# Patient Record
Sex: Female | Born: 1940 | Race: Black or African American | Hispanic: No | Smoking: Never smoker
Health system: Southern US, Community
[De-identification: ages and names within clinical notes are randomized; demographics above are authoritative.]

## PROBLEM LIST (undated history)

## (undated) DIAGNOSIS — D649 Anemia, unspecified: Secondary | ICD-10-CM

## (undated) DIAGNOSIS — J45909 Unspecified asthma, uncomplicated: Secondary | ICD-10-CM

## (undated) DIAGNOSIS — I10 Essential (primary) hypertension: Secondary | ICD-10-CM

## (undated) DIAGNOSIS — E669 Obesity, unspecified: Secondary | ICD-10-CM

## (undated) DIAGNOSIS — R519 Headache, unspecified: Secondary | ICD-10-CM

## (undated) DIAGNOSIS — E66811 Obesity, class 1: Secondary | ICD-10-CM

## (undated) DIAGNOSIS — R51 Headache: Secondary | ICD-10-CM

## (undated) DIAGNOSIS — J309 Allergic rhinitis, unspecified: Secondary | ICD-10-CM

## (undated) DIAGNOSIS — Z8669 Personal history of other diseases of the nervous system and sense organs: Secondary | ICD-10-CM

## (undated) HISTORY — DX: Obesity, class 1: E66.811

## (undated) HISTORY — DX: Personal history of other diseases of the nervous system and sense organs: Z86.69

## (undated) HISTORY — DX: Headache: R51

## (undated) HISTORY — DX: Headache, unspecified: R51.9

## (undated) HISTORY — PX: BUNIONECTOMY: SHX129

## (undated) HISTORY — DX: Allergic rhinitis, unspecified: J30.9

## (undated) HISTORY — DX: Obesity, unspecified: E66.9

## (undated) HISTORY — DX: Anemia, unspecified: D64.9

## (undated) HISTORY — PX: ABDOMINAL HYSTERECTOMY: SHX81

---

## 1997-08-27 ENCOUNTER — Ambulatory Visit (HOSPITAL_COMMUNITY): Admission: RE | Admit: 1997-08-27 | Discharge: 1997-08-27 | Payer: Self-pay | Admitting: Internal Medicine

## 1998-02-25 ENCOUNTER — Ambulatory Visit (HOSPITAL_COMMUNITY): Admission: RE | Admit: 1998-02-25 | Discharge: 1998-02-25 | Payer: Self-pay | Admitting: Internal Medicine

## 1998-05-20 ENCOUNTER — Inpatient Hospital Stay (HOSPITAL_COMMUNITY): Admission: AD | Admit: 1998-05-20 | Discharge: 1998-05-22 | Payer: Self-pay | Admitting: Internal Medicine

## 1998-05-20 ENCOUNTER — Encounter: Payer: Self-pay | Admitting: Internal Medicine

## 1998-05-21 ENCOUNTER — Encounter: Payer: Self-pay | Admitting: Internal Medicine

## 1998-09-07 ENCOUNTER — Other Ambulatory Visit: Admission: RE | Admit: 1998-09-07 | Discharge: 1998-09-07 | Payer: Self-pay | Admitting: Internal Medicine

## 1998-10-13 ENCOUNTER — Ambulatory Visit (HOSPITAL_COMMUNITY): Admission: RE | Admit: 1998-10-13 | Discharge: 1998-10-13 | Payer: Self-pay | Admitting: Gastroenterology

## 1999-03-12 ENCOUNTER — Encounter: Payer: Self-pay | Admitting: Emergency Medicine

## 1999-03-13 ENCOUNTER — Inpatient Hospital Stay (HOSPITAL_COMMUNITY): Admission: EM | Admit: 1999-03-13 | Discharge: 1999-03-19 | Payer: Self-pay | Admitting: Emergency Medicine

## 1999-04-21 ENCOUNTER — Encounter: Payer: Self-pay | Admitting: Internal Medicine

## 1999-04-21 ENCOUNTER — Encounter: Admission: RE | Admit: 1999-04-21 | Discharge: 1999-04-21 | Payer: Self-pay | Admitting: Internal Medicine

## 1999-07-26 ENCOUNTER — Ambulatory Visit (HOSPITAL_COMMUNITY): Admission: RE | Admit: 1999-07-26 | Discharge: 1999-07-26 | Payer: Self-pay | Admitting: Internal Medicine

## 1999-07-26 LAB — PULMONARY FUNCTION TEST

## 1999-09-13 ENCOUNTER — Emergency Department (HOSPITAL_COMMUNITY): Admission: EM | Admit: 1999-09-13 | Discharge: 1999-09-13 | Payer: Self-pay | Admitting: Emergency Medicine

## 1999-10-03 ENCOUNTER — Encounter: Payer: Self-pay | Admitting: Internal Medicine

## 1999-10-03 ENCOUNTER — Ambulatory Visit (HOSPITAL_COMMUNITY): Admission: RE | Admit: 1999-10-03 | Discharge: 1999-10-03 | Payer: Self-pay | Admitting: Internal Medicine

## 1999-10-06 ENCOUNTER — Encounter: Payer: Self-pay | Admitting: Internal Medicine

## 1999-10-07 ENCOUNTER — Inpatient Hospital Stay (HOSPITAL_COMMUNITY): Admission: RE | Admit: 1999-10-07 | Discharge: 1999-10-12 | Payer: Self-pay | Admitting: Internal Medicine

## 2000-04-26 ENCOUNTER — Encounter: Admission: RE | Admit: 2000-04-26 | Discharge: 2000-04-26 | Payer: Self-pay | Admitting: Internal Medicine

## 2000-04-26 ENCOUNTER — Encounter: Payer: Self-pay | Admitting: Internal Medicine

## 2000-07-02 ENCOUNTER — Other Ambulatory Visit: Admission: RE | Admit: 2000-07-02 | Discharge: 2000-07-02 | Payer: Self-pay | Admitting: Internal Medicine

## 2000-12-14 ENCOUNTER — Encounter: Payer: Self-pay | Admitting: Internal Medicine

## 2000-12-14 ENCOUNTER — Encounter: Admission: RE | Admit: 2000-12-14 | Discharge: 2000-12-14 | Payer: Self-pay | Admitting: Internal Medicine

## 2001-04-30 ENCOUNTER — Encounter: Payer: Self-pay | Admitting: Internal Medicine

## 2001-04-30 ENCOUNTER — Encounter: Admission: RE | Admit: 2001-04-30 | Discharge: 2001-04-30 | Payer: Self-pay | Admitting: Internal Medicine

## 2001-12-12 ENCOUNTER — Other Ambulatory Visit: Admission: RE | Admit: 2001-12-12 | Discharge: 2001-12-12 | Payer: Self-pay | Admitting: Internal Medicine

## 2001-12-13 ENCOUNTER — Encounter: Payer: Self-pay | Admitting: Internal Medicine

## 2001-12-13 ENCOUNTER — Encounter: Admission: RE | Admit: 2001-12-13 | Discharge: 2001-12-13 | Payer: Self-pay | Admitting: Internal Medicine

## 2002-05-27 ENCOUNTER — Encounter: Payer: Self-pay | Admitting: Internal Medicine

## 2002-05-27 ENCOUNTER — Encounter: Admission: RE | Admit: 2002-05-27 | Discharge: 2002-05-27 | Payer: Self-pay | Admitting: Internal Medicine

## 2003-06-22 ENCOUNTER — Encounter: Admission: RE | Admit: 2003-06-22 | Discharge: 2003-06-22 | Payer: Self-pay | Admitting: Internal Medicine

## 2003-07-24 ENCOUNTER — Ambulatory Visit (HOSPITAL_COMMUNITY): Admission: RE | Admit: 2003-07-24 | Discharge: 2003-07-24 | Payer: Self-pay | Admitting: Cardiology

## 2003-12-03 ENCOUNTER — Other Ambulatory Visit: Admission: RE | Admit: 2003-12-03 | Discharge: 2003-12-03 | Payer: Self-pay | Admitting: Internal Medicine

## 2004-07-01 ENCOUNTER — Encounter: Admission: RE | Admit: 2004-07-01 | Discharge: 2004-07-01 | Payer: Self-pay | Admitting: Internal Medicine

## 2004-12-08 ENCOUNTER — Other Ambulatory Visit: Admission: RE | Admit: 2004-12-08 | Discharge: 2004-12-08 | Payer: Self-pay | Admitting: Internal Medicine

## 2004-12-13 ENCOUNTER — Encounter: Admission: RE | Admit: 2004-12-13 | Discharge: 2004-12-13 | Payer: Self-pay | Admitting: Internal Medicine

## 2004-12-16 ENCOUNTER — Ambulatory Visit (HOSPITAL_COMMUNITY): Admission: RE | Admit: 2004-12-16 | Discharge: 2004-12-16 | Payer: Self-pay | Admitting: Gastroenterology

## 2004-12-16 ENCOUNTER — Encounter (INDEPENDENT_AMBULATORY_CARE_PROVIDER_SITE_OTHER): Payer: Self-pay | Admitting: Specialist

## 2004-12-21 ENCOUNTER — Other Ambulatory Visit: Admission: RE | Admit: 2004-12-21 | Discharge: 2004-12-21 | Payer: Self-pay | Admitting: Interventional Radiology

## 2004-12-21 ENCOUNTER — Encounter: Admission: RE | Admit: 2004-12-21 | Discharge: 2004-12-21 | Payer: Self-pay | Admitting: Internal Medicine

## 2004-12-21 ENCOUNTER — Encounter (INDEPENDENT_AMBULATORY_CARE_PROVIDER_SITE_OTHER): Payer: Self-pay | Admitting: Specialist

## 2005-07-04 ENCOUNTER — Encounter: Admission: RE | Admit: 2005-07-04 | Discharge: 2005-07-04 | Payer: Self-pay | Admitting: Internal Medicine

## 2005-08-03 ENCOUNTER — Encounter: Admission: RE | Admit: 2005-08-03 | Discharge: 2005-08-03 | Payer: Self-pay | Admitting: Internal Medicine

## 2005-11-10 ENCOUNTER — Emergency Department (HOSPITAL_COMMUNITY): Admission: EM | Admit: 2005-11-10 | Discharge: 2005-11-10 | Payer: Self-pay | Admitting: Emergency Medicine

## 2006-07-10 ENCOUNTER — Encounter: Admission: RE | Admit: 2006-07-10 | Discharge: 2006-07-10 | Payer: Self-pay | Admitting: Internal Medicine

## 2007-01-16 ENCOUNTER — Encounter: Admission: RE | Admit: 2007-01-16 | Discharge: 2007-01-16 | Payer: Self-pay | Admitting: Internal Medicine

## 2007-07-12 ENCOUNTER — Encounter: Admission: RE | Admit: 2007-07-12 | Discharge: 2007-07-12 | Payer: Self-pay | Admitting: Internal Medicine

## 2008-07-13 ENCOUNTER — Encounter: Admission: RE | Admit: 2008-07-13 | Discharge: 2008-07-13 | Payer: Self-pay | Admitting: Internal Medicine

## 2009-07-14 ENCOUNTER — Encounter: Admission: RE | Admit: 2009-07-14 | Discharge: 2009-07-14 | Payer: Self-pay | Admitting: Internal Medicine

## 2009-07-30 ENCOUNTER — Encounter: Admission: RE | Admit: 2009-07-30 | Discharge: 2009-07-30 | Payer: Self-pay | Admitting: Internal Medicine

## 2010-07-19 ENCOUNTER — Encounter
Admission: RE | Admit: 2010-07-19 | Discharge: 2010-07-19 | Payer: Self-pay | Source: Home / Self Care | Attending: Internal Medicine | Admitting: Internal Medicine

## 2010-10-06 ENCOUNTER — Ambulatory Visit
Admission: RE | Admit: 2010-10-06 | Discharge: 2010-10-06 | Disposition: A | Discharge status: Home / Self Care | Payer: BC Managed Care – PPO | Source: Ambulatory Visit | Attending: Internal Medicine | Admitting: Internal Medicine

## 2010-10-06 ENCOUNTER — Other Ambulatory Visit: Payer: Self-pay | Admitting: Internal Medicine

## 2010-10-06 DIAGNOSIS — R52 Pain, unspecified: Secondary | ICD-10-CM

## 2010-11-04 NOTE — Op Note (Signed)
Emily Mcclain, Emily Mcclain                ACCOUNT NO.:  [id number]   MEDICAL RECORD NO.:  [id number]          PATIENT TYPE:  AMB   LOCATION:  ENDO                         FACILITY:  [facility]   PHYSICIAN:  Anselmo Rod, M.D.  DATE OF BIRTH:  Nov 19, 1940   DATE OF PROCEDURE:  12/16/2004  DATE OF DISCHARGE:                                 OPERATIVE REPORT   PROCEDURES PERFORMED:  Colonoscopy with snare polypectomy x 1.   ENDOSCOPIST:  Anselmo Rod, M.D.   INSTRUMENT USED:  Olympus video colonoscope.   INDICATION FOR PROCEDURE:  A 70 year old African American female undergoing  a screen colonoscopy to rule out colonic polyps, masses, etc.   PREPROCEDURE PREPARATION:  Informed consent was procured from the patient.  The patient fasted for eight hours prior to the procedure and prepped with a  bottle of magnesium citrate and a gallon of GoLYTELY the night prior to the  procedure.  The risks and benefits of the procedure, including a 10% missed  rate of cancer and polyps, were discussed with her as well.   PREPROCEDURE PHYSICAL EXAMINATION:  VITAL SIGNS:  Stable.  NECK:  Supple.  CHEST:  Clear to auscultation.  HEART:  S1 and S2 regular.  ABDOMEN:  Soft with normal bowel sounds.   DESCRIPTION OF PROCEDURE:  The patient was placed in the left lateral  decubitus position.  Sedated with 75 mg of Demerol and 7.5 mg of Versed in  slow incremental doses. Once the patient was adequately sedated and  maintained on low-flow oxygen and continuous cardiac monitoring, the Olympus  video colonoscope was advanced from the rectum to the cecum.  The patient  had an excellent prep.  A large pedunculated polyp was snared from the  proximal right colon.  Small internal hemorrhoids were seen on retroflexion.  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  The  appendiceal orifice and ileocecal valve were clearly visualized and  photographed.  No other abnormalities were noted.  The  patient tolerated the  procedure well without complications.   IMPRESSION:  1.  Large pedunculated polyp snared from the proximal right colon.  2.  Isolated diverticulum noted in proximal right colon close to the cecum.  3.  Small internal hemorrhoids seen on retroflexion.  4.  Healthy-appearing distal right colon, transverse colon, descending      colon, rectosigmoid colon and rectum.   RECOMMENDATIONS:  1.  Await pathology results.  2.  Repeat colonoscopy depending on pathology results.  3.  Avoid all nonsteroidals, including aspirin, for the next two weeks.  4.  Outpatient followup as the need arises in the future.       JNM/MEDQ  D:  12/17/2004  T:  12/18/2004  Job:  [id number]   cc:   Minerva Areola L. August Saucer, M.D.  [address] [address]  [place]  [place] [address]  Fax: [phone]

## 2010-11-04 NOTE — Discharge Summary (Signed)
[place]  Patient:    Emily Mcclain, Emily Mcclain                       MRN: [id number] Adm. Date:  [id number] Disc. Date: [id number] Attending:  Gwenyth Bender                           Discharge Summary  FINAL DIAGNOSES: 1. Extrinsic asthma with acute exacerbation. 2. Depressive disorder. 3. Diaphragmatic hernia. 4. Esophageal reflux. 5. Hypertension. 6. Hypokalemia.  OPERATIONS/PROCEDURES:  None.  HISTORY OF PRESENT ILLNESS:  This was one of several [facility] admissions for this 70 year old married black female with long-standing history of asthma who presented to the office complaining of increasing shortness of breath.  She had been doing well until two days prior to admission when she became more short of breath at work after being exposed to new carpeting.  The patient noted increasing tightness in her chest during that time with intermittent cough as well.  She used her inhalers, but this did not relieve her symptoms.  She subsequently noted progressive decline and required admission to the hospital for further evaluation and therapy.  PAST MEDICAL HISTORY:  As per admission H&P.  PHYSICAL EXAMINATION:  As per admission H&P.  HOSPITAL COURSE:  The patient was admitted for further treatment of her acute asthma.  She was placed on IV Solu-Medrol as well as IV antibiotics and intensive treatment with nebulizer therapy.  She was also started on low-flow oxygen for support.  She began with daily peak flows as a gauge for her asthma.  She notably started as low as 150.  The patient also complained of intermittent heartburn with reflux-type symptoms.  She was known previously to have a hiatal hernia and was, therefore, started back on a GERD regimen with Protonix.  At the time of presentation, her potassium was low also at 3.2.  This required replacement. Over the subsequent days, the patient made gradual improvement.  As she remained on her  prednisone, it was noted that her blood pressure did begin to increase.  She subsequently was given Cardizem low dose per Dr. Sharyn Lull, which she tolerated well.  With continued therapy, her pulmonary status did improve substantially.  She, however, reached a maximum peak flow in the hospital at 230.  By this time, she was more ambulatory.  She had significant decrease in wheezing as well.  The patients IV Solu-Medrol was tapered, and she was switched over the prednisone orally with gradual taper.  It should be noted that she was also given Singulair at a dose of 10 mg p.o. q.d. which she tolerated well.  By October 12, 1999, the patient was felt to be stable for discharge.  DISCHARGE MEDICATIONS:  1. Tiazac 120 mg p.o. q.d.  2. Claritin 10 mg q.d.  3. Zoloft 50 mg q.h.s.  4. Protonix in place of Prevacid 40 mg q.h.s.  5. Singulair 10 mg q.d.  6. Ventolin hand-held nebulizer q.i.d.  7. Atrovent 2.5 cc via hand-held nebulizer q.i.d.  8. Prednisone 5 mg Dosepak with taper.  9. She will continue on Flovent 220 mcg 2 puffs b.i.d. 10. Serevent 2 puffs b.i.d.  DIET:  A 4 g sodium diet.  FOLLOW-UP:  She will call the office for a follow-up appointment in one weeks time. DD:  11/23/99 TD:  11/28/99 Job: [id number]

## 2010-12-26 ENCOUNTER — Other Ambulatory Visit (HOSPITAL_COMMUNITY)
Admission: RE | Admit: 2010-12-26 | Discharge: 2010-12-26 | Disposition: A | Discharge status: Home / Self Care | Payer: Medicare Other | Source: Ambulatory Visit | Attending: Internal Medicine | Admitting: Internal Medicine

## 2010-12-26 ENCOUNTER — Other Ambulatory Visit: Payer: Self-pay | Admitting: Internal Medicine

## 2010-12-26 DIAGNOSIS — Z124 Encounter for screening for malignant neoplasm of cervix: Secondary | ICD-10-CM | POA: Insufficient documentation

## 2011-06-22 ENCOUNTER — Other Ambulatory Visit: Payer: Self-pay | Admitting: Internal Medicine

## 2011-06-22 DIAGNOSIS — Z1231 Encounter for screening mammogram for malignant neoplasm of breast: Secondary | ICD-10-CM

## 2011-07-21 ENCOUNTER — Ambulatory Visit
Admission: RE | Admit: 2011-07-21 | Discharge: 2011-07-21 | Disposition: A | Discharge status: Home / Self Care | Payer: Medicare Other | Source: Ambulatory Visit | Attending: Internal Medicine | Admitting: Internal Medicine

## 2011-07-21 DIAGNOSIS — Z1231 Encounter for screening mammogram for malignant neoplasm of breast: Secondary | ICD-10-CM

## 2011-12-10 ENCOUNTER — Emergency Department (HOSPITAL_BASED_OUTPATIENT_CLINIC_OR_DEPARTMENT_OTHER): Payer: Medicare Other

## 2011-12-10 ENCOUNTER — Emergency Department (HOSPITAL_BASED_OUTPATIENT_CLINIC_OR_DEPARTMENT_OTHER)
Admission: EM | Admit: 2011-12-10 | Discharge: 2011-12-10 | Disposition: A | Discharge status: Home / Self Care | Payer: Medicare Other | Attending: Emergency Medicine | Admitting: Emergency Medicine

## 2011-12-10 ENCOUNTER — Encounter (HOSPITAL_BASED_OUTPATIENT_CLINIC_OR_DEPARTMENT_OTHER): Payer: Self-pay | Admitting: *Deleted

## 2011-12-10 DIAGNOSIS — R5381 Other malaise: Secondary | ICD-10-CM | POA: Insufficient documentation

## 2011-12-10 DIAGNOSIS — R51 Headache: Secondary | ICD-10-CM | POA: Insufficient documentation

## 2011-12-10 DIAGNOSIS — H539 Unspecified visual disturbance: Secondary | ICD-10-CM | POA: Insufficient documentation

## 2011-12-10 DIAGNOSIS — R059 Cough, unspecified: Secondary | ICD-10-CM | POA: Insufficient documentation

## 2011-12-10 DIAGNOSIS — R05 Cough: Secondary | ICD-10-CM | POA: Insufficient documentation

## 2011-12-10 HISTORY — DX: Unspecified asthma, uncomplicated: J45.909

## 2011-12-10 HISTORY — DX: Essential (primary) hypertension: I10

## 2011-12-10 NOTE — ED Notes (Signed)
Pt reports developing a severe headache yesterday morning. States that she has no history of headaches. Came to the ER for further evaluation. Pt neuro intact.

## 2011-12-10 NOTE — ED Provider Notes (Signed)
History   This chart was scribed for Rolan Bucco, MD by Charolett Bumpers . The patient was seen in room MH05/MH05.    CSN: [id number]  Arrival date & time 12/10/11  1507   First MD Initiated Contact with Patient 12/10/11 1746      Chief Complaint  Patient presents with  . Headache    (Consider location/radiation/quality/duration/timing/severity/associated sxs/prior treatment) HPI Emily Mcclain is a 71 y.o. female who presents to the Emergency Department complaining of intermittent, moderate left-sided frontal headache behind left eye and radiates to left side of neck that started yesterday. Patient describes the onset of the headache as sudden. Patient describes the headache as throbbing. Patient states that her headache subsided yesterday, but returned this morning. Patient states that her headache currently has improved. Patient states that she took OTC sinus medication with her last dose this morning around 8 am with some relief. Patient denies any double-vision or blurry vision. Patient reports some visual disturbance in her right eye with the initial onset (glare, that lasted only a couple of minutes). Patient denies any facial numbness, trouble talking, trouble swallowing, or dizziness. Patient denies any numbness, weakness or tingling in upper extremities. Patient denies any leg pain. Patient reports bilaterally "funny feeling in her thighs" that started yesterday. Says that they are not numb/weak/tingling/painful.  No symptoms now.  Patient states that she felt off balanced for a few seconds at a time. No balance problems since then.  Patient denies any burning, pain or numbness in lower extremities. Patient reports a mild associated cough and states that her headache is aggravated with coughing. Patient denies any h/o headaches or migraines. Patient denies any recent head injuries.   Past Medical History  Diagnosis Date  . Hypertension   . Asthma     Past Surgical History    Procedure Date  . Abdominal hysterectomy     History reviewed. No pertinent family history.  History  Substance Use Topics  . Smoking status: Never Smoker   . Smokeless tobacco: Not on file  . Alcohol Use: No    OB History    Grav Para Term Preterm Abortions TAB SAB Ect Mult Living                  Review of Systems  Constitutional: Negative for fever.  HENT: Negative for congestion, sore throat and rhinorrhea.   Eyes: Positive for visual disturbance.  Respiratory: Positive for cough. Negative for shortness of breath.   Cardiovascular: Negative for chest pain.  Gastrointestinal: Negative for nausea, vomiting and abdominal pain.  Genitourinary: Negative for dysuria.  Skin: Negative for rash.  Neurological: Positive for weakness and headaches.  All other systems reviewed and are negative.    Allergies  Review of patient's allergies indicates no known allergies.  Home Medications   Current Outpatient Rx  Name Route Sig Dispense Refill  . CETIRIZINE HCL 10 MG PO TABS Oral Take 10 mg by mouth daily. Patient uses this medication for allergies.      BP 169/86  Pulse 62  Temp 98.1 F (36.7 C) (Oral)  Resp 20  Ht 4\' 9"  (1.448 m)  Wt 149 lb (67.586 kg)  BMI 32.24 kg/m2  SpO2 100%  Physical Exam  Nursing note and vitals reviewed. Constitutional: She is oriented to person, place, and time. She appears well-developed and well-nourished. No distress.  HENT:  Head: Normocephalic and atraumatic.       No facial pain or tenderness over sinuses.  Eyes: EOM are normal. Pupils are equal, round, and reactive to light.  Neck: Normal range of motion. Neck supple. No tracheal deviation present.  Cardiovascular: Normal rate, regular rhythm and normal heart sounds.   Pulmonary/Chest: Effort normal and breath sounds normal. No respiratory distress.  Abdominal: Soft. Bowel sounds are normal. She exhibits no distension. There is no tenderness.  Musculoskeletal: Normal range of  motion. She exhibits no edema.  Neurological: She is alert and oriented to person, place, and time. She has normal strength. No cranial nerve deficit or sensory deficit. Coordination normal. GCS eye subscore is 4. GCS verbal subscore is 5. GCS motor subscore is 6.       Finger to nose test normal   Skin: Skin is warm and dry.  Psychiatric: She has a normal mood and affect. Her behavior is normal.    ED Course  Procedures (including critical care time)  DIAGNOSTIC STUDIES: Oxygen Saturation is 100% on room air, normal by my interpretation.    COORDINATION OF CARE:  1800: Discussed planned course of treatment with the patient, who is agreeable at this time.    No results found for this or any previous visit. Ct Head Wo Contrast  12/10/2011  *RADIOLOGY REPORT*  Clinical Data: Headache.  CT HEAD WITHOUT CONTRAST  Technique:  Contiguous axial images were obtained from the base of the skull through the vertex without contrast.  Comparison: None  Findings: The ventricles are normal.  No extra-axial fluid collections are seen.  The brainstem and cerebellum are unremarkable.  No acute intracranial findings such as infarction or hemorrhage.  No mass lesions.  Cavum septum pellucidum noted.  The bony calvarium is intact. The paranasal sinuses are clear. Left mastoid effusions are noted.  IMPRESSION: No acute intracranial findings or mass lesion.  Original Report Authenticated By: P. Loralie Champagne, M.D.     1. Headache       MDM  Pt with intermittent headache since yesterday.  No other neuro symptoms.  No dizziness.  Only minimal pain now.  No mass/bleeding noted on CT.  With only minimal headache now, have low suspicion for [facility], CVA.  Will d/c to f/u with PMD tomorrow if not better   I personally performed the services described in this documentation, which was scribed in my presence.  The recorded information has been reviewed and considered.       Rolan Bucco, MD 12/10/11 [phone]

## 2011-12-10 NOTE — Discharge Instructions (Signed)

## 2011-12-10 NOTE — ED Notes (Signed)
Pt describes left side head pain. Feels light-headed at times. Legs feel "queasy". Denies other s/s. PERL

## 2012-06-25 ENCOUNTER — Other Ambulatory Visit: Payer: Self-pay | Admitting: Internal Medicine

## 2012-06-25 DIAGNOSIS — Z1231 Encounter for screening mammogram for malignant neoplasm of breast: Secondary | ICD-10-CM

## 2012-07-22 ENCOUNTER — Ambulatory Visit
Admission: RE | Admit: 2012-07-22 | Discharge: 2012-07-22 | Disposition: A | Discharge status: Home / Self Care | Payer: Medicare Other | Source: Ambulatory Visit | Attending: Internal Medicine | Admitting: Internal Medicine

## 2012-07-22 DIAGNOSIS — Z1231 Encounter for screening mammogram for malignant neoplasm of breast: Secondary | ICD-10-CM

## 2012-08-07 LAB — CBC AND DIFFERENTIAL: HCT: 34 % — AB (ref 36–46)

## 2012-08-07 LAB — LIPID PANEL
HDL: 61 mg/dL (ref 35–70)
LDL Cholesterol: 119 mg/dL
Triglycerides: 52 mg/dL (ref 40–160)

## 2012-10-31 ENCOUNTER — Encounter: Payer: Self-pay | Admitting: Hematology

## 2013-07-08 ENCOUNTER — Other Ambulatory Visit: Payer: Self-pay

## 2013-07-08 DIAGNOSIS — Z1231 Encounter for screening mammogram for malignant neoplasm of breast: Secondary | ICD-10-CM

## 2013-07-31 ENCOUNTER — Ambulatory Visit
Admission: RE | Admit: 2013-07-31 | Discharge: 2013-07-31 | Disposition: A | Discharge status: Home / Self Care | Payer: 59 | Source: Ambulatory Visit

## 2013-07-31 DIAGNOSIS — Z1231 Encounter for screening mammogram for malignant neoplasm of breast: Secondary | ICD-10-CM

## 2014-01-13 ENCOUNTER — Encounter: Payer: Self-pay | Admitting: Internal Medicine

## 2014-05-29 IMAGING — MG MM DIGITAL SCREENING BILAT
4 series · 4 of 4 positions shown · non-contrast
Comparison: 07/04/2005

CLINICAL DATA: Screening.

DIGITAL BILATERAL SCREENING MAMMOGRAM WITH CAD
off

[R CC]
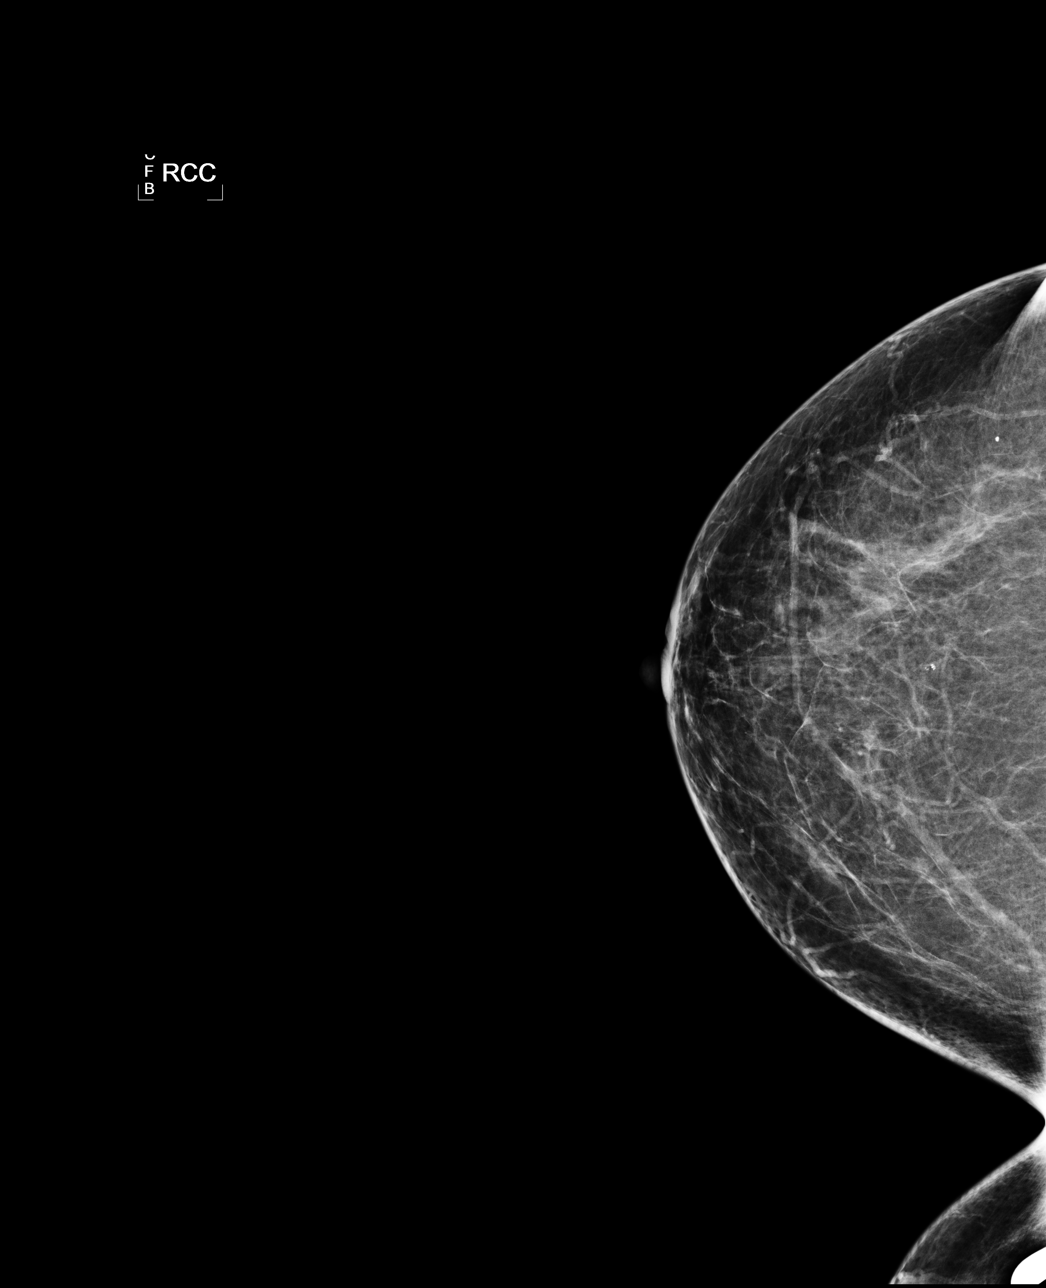

[L CC]
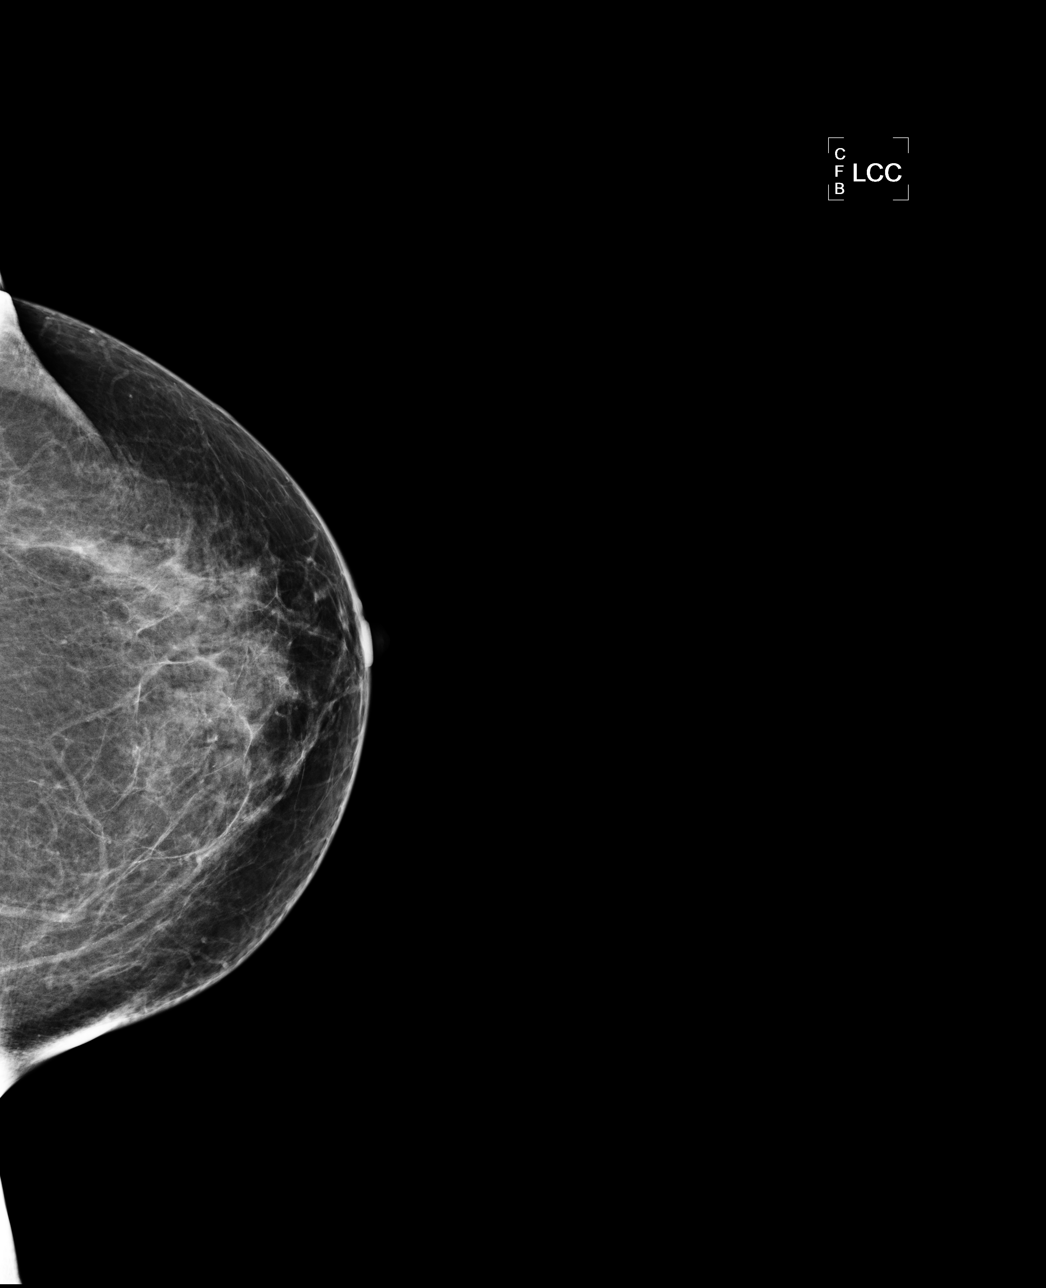

[L MLO]
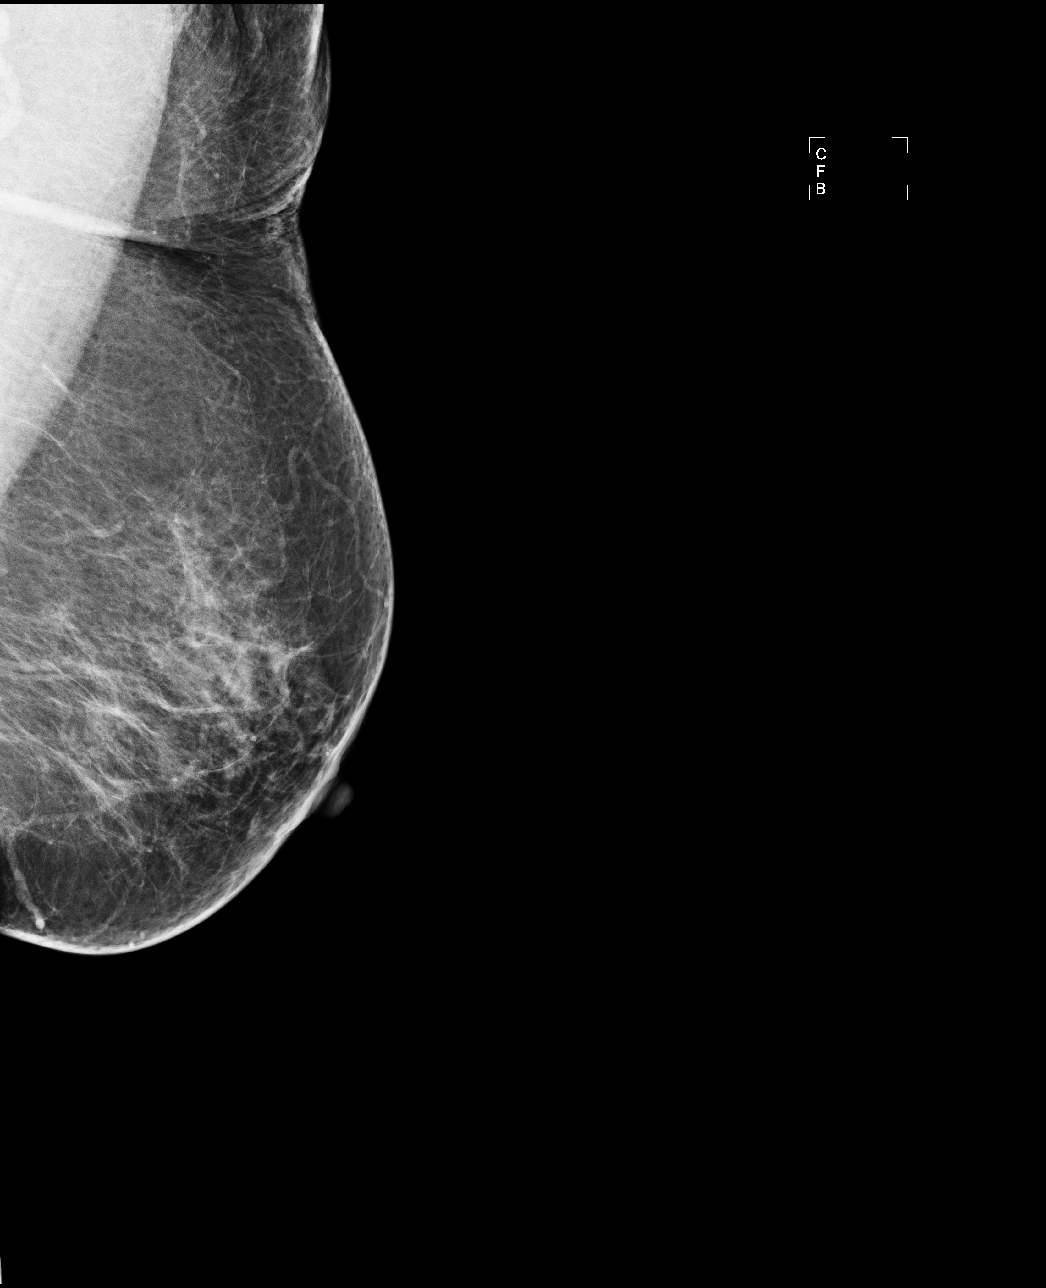

[R MLO]
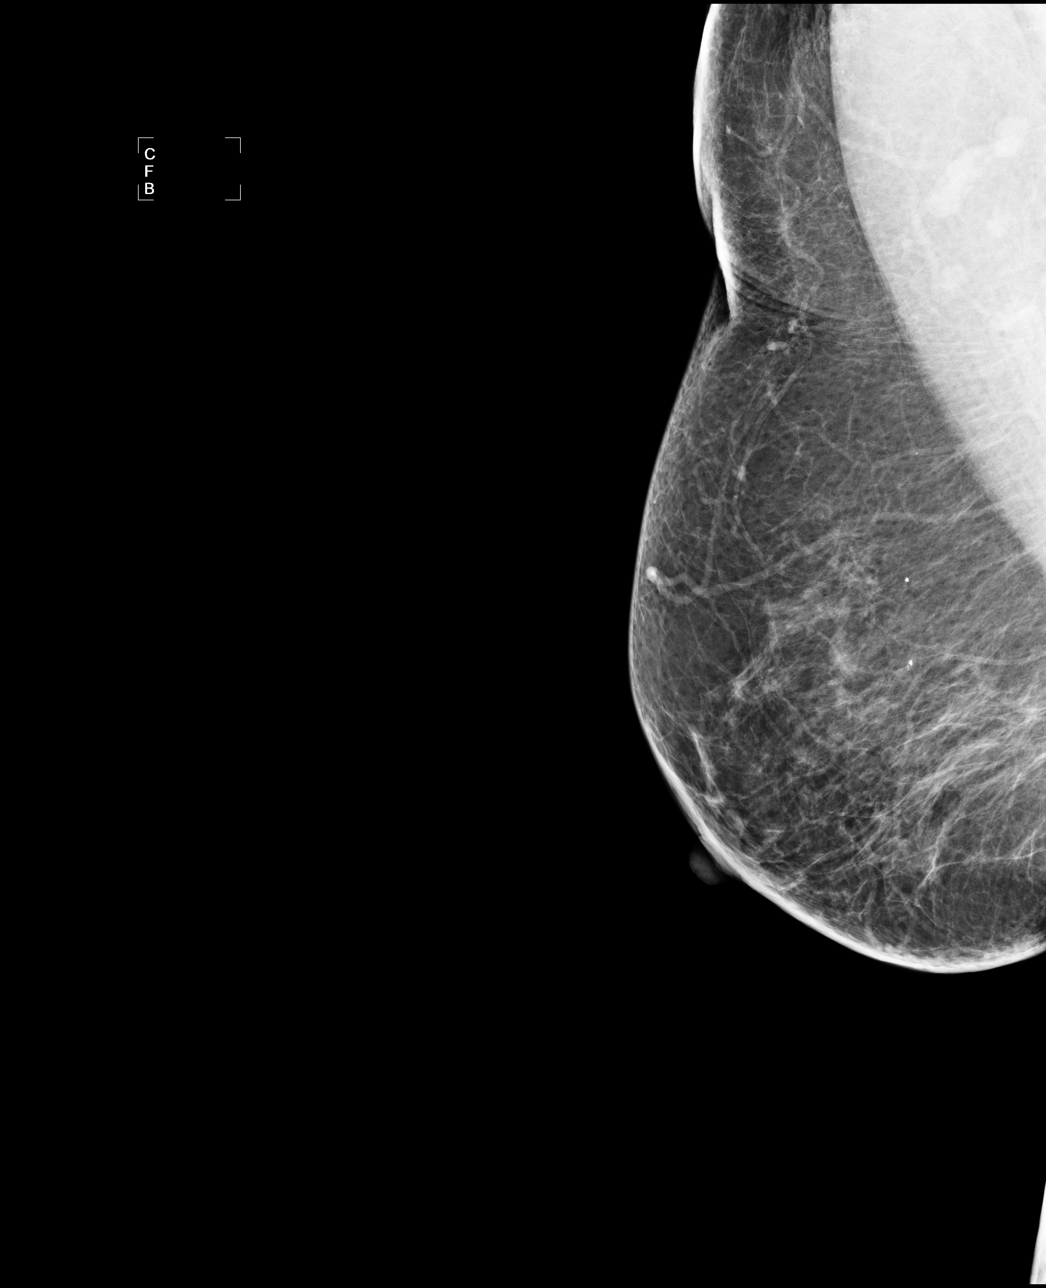

[4 of 4 positions shown; findings below may reference images not displayed]

FINDINGS: ACR Breast Density Category 2: There are scattered fibroglandular
densities.

There is no suspicious dominant mass, architectural distortion, or
calcification to suggest malignancy.

Images were processed with CAD.
IMPRESSION: No mammographic evidence of malignancy.

A result letter of this screening mammogram will be mailed directly
to the patient.

RECOMMENDATION:
Screening mammogram in one year. (Code:I1-C-YF4)

BI-RADS CATEGORY 1:  Negative.

## 2014-06-29 ENCOUNTER — Other Ambulatory Visit: Payer: Self-pay

## 2014-06-29 DIAGNOSIS — Z1231 Encounter for screening mammogram for malignant neoplasm of breast: Secondary | ICD-10-CM

## 2014-08-03 ENCOUNTER — Ambulatory Visit: Payer: Self-pay

## 2014-08-14 ENCOUNTER — Ambulatory Visit
Admission: RE | Admit: 2014-08-14 | Discharge: 2014-08-14 | Disposition: A | Discharge status: Home / Self Care | Payer: Medicare Other | Source: Ambulatory Visit

## 2014-08-14 DIAGNOSIS — Z1231 Encounter for screening mammogram for malignant neoplasm of breast: Secondary | ICD-10-CM

## 2015-07-12 ENCOUNTER — Other Ambulatory Visit: Payer: Self-pay

## 2015-07-12 DIAGNOSIS — Z1231 Encounter for screening mammogram for malignant neoplasm of breast: Secondary | ICD-10-CM

## 2015-08-16 ENCOUNTER — Ambulatory Visit
Admission: RE | Admit: 2015-08-16 | Discharge: 2015-08-16 | Disposition: A | Discharge status: Home / Self Care | Payer: Medicare Other | Source: Ambulatory Visit

## 2015-08-16 DIAGNOSIS — Z1231 Encounter for screening mammogram for malignant neoplasm of breast: Secondary | ICD-10-CM

## 2016-07-24 ENCOUNTER — Other Ambulatory Visit: Payer: Self-pay | Admitting: Internal Medicine

## 2016-07-24 DIAGNOSIS — Z1231 Encounter for screening mammogram for malignant neoplasm of breast: Secondary | ICD-10-CM

## 2016-08-21 ENCOUNTER — Ambulatory Visit: Payer: Medicare Other

## 2016-10-02 ENCOUNTER — Ambulatory Visit
Admission: RE | Admit: 2016-10-02 | Discharge: 2016-10-02 | Disposition: A | Discharge status: Home / Self Care | Payer: Medicare Other | Source: Ambulatory Visit | Attending: Internal Medicine | Admitting: Internal Medicine

## 2016-10-02 DIAGNOSIS — Z1231 Encounter for screening mammogram for malignant neoplasm of breast: Secondary | ICD-10-CM

## 2017-08-27 ENCOUNTER — Other Ambulatory Visit: Payer: Self-pay | Admitting: Internal Medicine

## 2017-08-27 DIAGNOSIS — Z1231 Encounter for screening mammogram for malignant neoplasm of breast: Secondary | ICD-10-CM

## 2017-10-08 ENCOUNTER — Ambulatory Visit
Admission: RE | Admit: 2017-10-08 | Discharge: 2017-10-08 | Disposition: A | Discharge status: Home / Self Care | Payer: Medicare Other | Source: Ambulatory Visit | Attending: Internal Medicine | Admitting: Internal Medicine

## 2017-10-08 DIAGNOSIS — Z1231 Encounter for screening mammogram for malignant neoplasm of breast: Secondary | ICD-10-CM

## 2018-03-04 ENCOUNTER — Other Ambulatory Visit: Payer: Self-pay | Admitting: Surgery

## 2018-03-04 DIAGNOSIS — H4900 Third [oculomotor] nerve palsy, unspecified eye: Secondary | ICD-10-CM

## 2018-03-10 ENCOUNTER — Ambulatory Visit
Admission: RE | Admit: 2018-03-10 | Discharge: 2018-03-10 | Disposition: A | Discharge status: Home / Self Care | Payer: Medicare Other | Source: Ambulatory Visit | Attending: Surgery | Admitting: Surgery

## 2018-03-10 DIAGNOSIS — H4900 Third [oculomotor] nerve palsy, unspecified eye: Secondary | ICD-10-CM

## 2018-03-10 MED ORDER — GADOBENATE DIMEGLUMINE 529 MG/ML IV SOLN
13.0000 mL | Freq: Once | INTRAVENOUS | Status: AC | PRN
  Administered 2018-03-10: 13 mL via INTRAVENOUS

## 2018-06-27 ENCOUNTER — Encounter: Payer: Self-pay | Admitting: Neurology

## 2018-06-27 ENCOUNTER — Telehealth: Payer: Self-pay | Admitting: Neurology

## 2018-06-27 ENCOUNTER — Ambulatory Visit: Payer: Medicare Other | Admitting: Neurology

## 2018-06-27 ENCOUNTER — Encounter

## 2018-06-27 VITALS — BP 132/75 | HR 86 | Ht 60.0 in | Wt 143.8 lb

## 2018-06-27 DIAGNOSIS — H499 Unspecified paralytic strabismus: Secondary | ICD-10-CM

## 2018-06-27 DIAGNOSIS — H4902 Third [oculomotor] nerve palsy, left eye: Secondary | ICD-10-CM | POA: Diagnosis not present

## 2018-06-27 DIAGNOSIS — G7000 Myasthenia gravis without (acute) exacerbation: Secondary | ICD-10-CM | POA: Diagnosis not present

## 2018-06-27 DIAGNOSIS — H02402 Unspecified ptosis of left eyelid: Secondary | ICD-10-CM | POA: Diagnosis not present

## 2018-06-27 DIAGNOSIS — I671 Cerebral aneurysm, nonruptured: Secondary | ICD-10-CM

## 2018-06-27 NOTE — Progress Notes (Signed)
[facility]    Provider:  Dr Lucia Gaskins Referring Provider: Wynell Balloon MD Primary Care Physician:  Gwenyth Bender, MD  CC:  Diplopia  HPI:  Emily Mcclain is a 78 y.o. female here as requested by Dr. Alben Spittle for Myasthenia Gravis. Here with daughter who provides information. PMHx obesity, HTN, headaches, myasthania gravis?. 10 years ago she had double vision and lasted 3-4 months in the setting of stress, was possibly diagnosed as stress, unclear she doesn't even know. She saw a neurologist in [place] but she doesn't remember what they said or if she was diagnosed. She started going to [facility] ophthalmologist and the diagnosis was carried on. She was never treated for myasthenia gravis. This is the only time since (over 15 years) she has had diplopia. Diplopia again started in May of this year. Her eye doesn't move, she also had a lid droop this time, these were new and different than before. Left eye. The eye is improving. She has HTN. Her pill was cut in half and a month or so later she had the diplopia. The double vision is fine in the morning. No problems swallowing or chewing or weakness or breathing.   Reviewed notes, labs and imaging from outside physicians, which showed:  MRI brain personally reviewed images and agree with the following:No acute intracranial abnormality.Moderate for age signal changes in the cerebral white matter arenonspecific, but most commonly due to chronic small vessel disease. Otherwise normal for age MRI appearance of the brain.  Review of Systems: Patient complains of symptoms per HPI as well as the following symptoms: double vision. Pertinent negatives and positives per HPI. All others negative.   Social History   Socioeconomic History  . Marital status: Married    Spouse name: Not on file  . Number of children: 4  . Years of education: 86  . Highest education level: Some college, no degree  Occupational History  . Not on file    Social Needs  . Financial resource strain: Not on file  . Food insecurity:    Worry: Not on file    Inability: Not on file  . Transportation needs:    Medical: Not on file    Non-medical: Not on file  Tobacco Use  . Smoking status: Never Smoker  . Smokeless tobacco: Never Used  Substance and Sexual Activity  . Alcohol use: No  . Drug use: No  . Sexual activity: Not on file  Lifestyle  . Physical activity:    Days per week: Not on file    Minutes per session: Not on file  . Stress: Not on file  Relationships  . Social connections:    Talks on phone: Not on file    Gets together: Not on file    Attends religious service: Not on file    Active member of club or organization: Not on file    Attends meetings of clubs or organizations: Not on file    Relationship status: Not on file  . Intimate partner violence:    Fear of current or ex partner: Not on file    Emotionally abused: Not on file    Physically abused: Not on file    Forced sexual activity: Not on file  Other Topics Concern  . Not on file  Social History Narrative   Lives alone, spends most of her time with her daughters   Right handed   Caffeine: no    Family History  Problem Relation Age  of Onset  . Sudden death Father     Past Medical History:  Diagnosis Date  . Allergic rhinitis   . Anemia   . Asthma   . Generalized headaches   . H/O myasthenia gravis    not confirmed per daughter  . Hypertension   . Obesity (BMI 30.0-34.9)     Past Surgical History:  Procedure Laterality Date  . ABDOMINAL HYSTERECTOMY    . BUNIONECTOMY Left     Current Outpatient Medications  Medication Sig Dispense Refill  . Cholecalciferol (VITAMIN D3 PO) Take 1,000 Int'l Units by mouth daily.    Marland Kitchen. diltiazem (CARTIA XT) 180 MG 24 hr capsule Take 180 mg by mouth daily.    Marland Kitchen. triamterene-hydrochlorothiazide (DYAZIDE) 37.5-25 MG capsule Take 1 capsule by mouth daily.     No current facility-administered medications for  this visit.     Allergies as of 06/27/2018 - Review Complete 06/27/2018  Allergen Reaction Noted  . Asa [aspirin] Shortness Of Breath 10/31/2012    Vitals: BP 132/75 (BP Location: Right Arm, Patient Position: Sitting)   Pulse 86   Ht 5' (1.524 m)   Wt 143 lb 12.8 oz (65.2 kg)   BMI 28.08 kg/m  Last Weight:  Wt Readings from Last 1 Encounters:  06/27/18 143 lb 12.8 oz (65.2 kg)   Last Height:   Ht Readings from Last 1 Encounters:  06/27/18 5' (1.524 m)   Physical exam: Exam: Gen: NAD, conversant, well nourised, well groomed                     CV: RRR, no MRG. No Carotid Bruits. No peripheral edema, warm, nontender Eyes: Conjunctivae clear without exudates or hemorrhage  Neuro: Detailed Neurologic Exam  Speech:    Speech is normal; fluent and spontaneous with normal comprehension.  Cognition:    The patient is oriented to person, place, and time;     recent and remote memory impaired, poor historian;     language fluent;     Impaired attention, concentration,fund of knowledge Cranial Nerves:     Diffculty with viewing pupils due to small pupils and very dark eyes. Difficulty with fundoscopy due to small pupils. Visual fields are full to finger confrontation. Left ptosis mild, left prominent 3rd nerve and 4th nerve palsy and possible left minimal 6th nerve palsy, fatiguable. Trigeminal sensation is intact and the muscles of mastication are normal. The palate elevates in the midline. Hearing intact. Voice is normal. Shoulder shrug is normal. The tongue has normal motion without fasciculations.   Coordination:    Normal finger to nose and heel to shin.   Gait:    Normal native gait  Motor Observation:    No asymmetry, no atrophy, and no involuntary movements noted. Tone:    Normal muscle tone.    Posture:    Posture is normal. normal erect    Strength:    Strength is V/V in the upper and lower limbs.      Sensation: intact to LT     Reflex Exam:  DTR's:     Deep tendon reflexes in the upper and lower extremities are symmetrical bilaterally.   Toes:    The toes are downgoing bilaterally.   Clonus:    Clonus is absent.       Assessment/Plan:  78 y.o. 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 PMHx obesity, HTN, headaches, myasthania gravis.  10 years ago she had double vision which lasted 3-4 months. She saw a neurologist in [place] and diagnosed with ocular MG. She started going to [facility] ophthalmologist and the diagnosis was carried on but she never had another occurrence until now. She was never treated for myasthenia gravis.  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  Patient reports that when she wakes up in the morning her symptoms are not there and they become apparent during the day.  No signs of generalization. -Given ptosis and very prominent 3rd nerve paresis and inability to see pupils need to check a CTA of the head to ensure there is no aneurysm however this is less likely. Will check AchR Ab labs but explained these may be negative in Ocular MG. -MRI of the brain did not show any other etiology and was unremarkable. -Provided literature, information and online sites on myasthenia gravis to daughter and mother who seemed tentative about the diagnosis and want to go to [facility]. I will perform this testing but I will refer her to her neuromuscular specialist at [facility]. -Discussed myasthenic crisis and red flags to call 911. -I have scheduled a 1 hour appointment in 4 weeks with patient and daughter to review results of lab work, imaging and discuss treatments unless we can get her to [facility] first. - May also need CT of the thorax to evaluate for Thymoma, discussed and they prefer to hold off  until testing below completed.  Orders Placed This Encounter  Procedures  . CT ANGIO HEAD W OR WO CONTRAST  . Acetylcholine receptor, binding  . Acetylcholine receptor, blocking  . Acetylcholine receptor, modulating  . Comprehensive metabolic panel  . CBC  . Ambulatory referral to Neurology     Naomie Dean, MD  [facility] [address] [place], [place] [address]  Phone [phone] Fax [phone]

## 2018-06-27 NOTE — Patient Instructions (Signed)
Labwork Cat scan of the blood vessels of the head Follow up 4 weeks for one hour.   Myasthenia Gravis Myasthenia gravis (MG) is a long-term (chronic) condition that causes weakness in the muscles you can control (voluntary muscles). MG can affect any voluntary muscle. The muscles most often affected are the ones that control:  Eye movement.  Facial movements.  Swallowing. MG is a disease in which the body's disease-fighting system (immune system) attacks its own healthy tissues (autoimmune disease). When you have MG, your immune system makes proteins (antibodies) that block the chemical (acetylcholine) that your body needs to send nerve signals to your muscles. This causes muscle weakness. What are the causes? The exact cause of MG is not known. What increases the risk? The following factors may make you more likely to develop this condition:  Having an enlarged thymus gland. The thymus gland is located under the breastbone. It makes certain cells for the immune system.  Having a family history of MG. What are the signs or symptoms? Symptoms of MG may include:  Drooping eyelids.  Double vision.  Muscle weakness that gets worse with activity and gets better after rest.  Difficulty walking.  Trouble chewing and swallowing.  Trouble making facial expressions.  Slurred speech.  Weakness of the arms, hands, and legs. Sudden, severe difficulty breathing (myasthenic crisis) may develop after having:  An infection.  A fever.  A bad reaction to a medicine. Myasthenic crisis requires emergency breathing support. Sometimes symptoms of MG go away for a while (remission) and then come back later. How is this diagnosed? This condition may be diagnosed based on:  Your symptoms and medical history.  A physical exam.  Blood tests.  Tests of your muscle strength and function.  Imaging tests, such as a CT scan or an MRI. How is this treated? The goal of treatment is to  improve muscle strength. Treatment may include:  Taking medicine.  Making lifestyle changes that focus on saving your energy.  Doing physical therapy to gain strength.  Having surgery to remove the thymus gland (thymectomy). This may result in a long remission for some people.  Having a procedure to remove the acetylcholine antibodies (plasmapheresis).  Getting emergency breathing support, if you experience myasthenic crisis. If you experience remission, you may be able to stop treatment and then resume treatment when your symptoms return. Follow these instructions at home:   Take over-the-counter and prescription medicines only as told by your health care provider.  Get plenty of rest and sleep. Take frequent breaks to rest your eyes, especially when in bright light or working on a computer.  Maintain a healthy diet and a healthy weight. Work with your health care provider or a diet and nutrition specialist (dietitian) if you need help.  Do exercises as told by your health care provider or physical therapist.  Do not use any products that contain nicotine or tobacco, such as cigarettes and e-cigarettes. If you need help quitting, ask your health care provider.  Prevent infections by: ? Washing your hands often with soap and water. If soap and water are not available, use hand sanitizer. ? Avoiding contact with other people who are sick. ? Avoiding touching your eyes, nose, and mouth. ? Cleaning surfaces in your home that are touched often using a disinfectant.  Keep all follow-up visits as told by your health care provider. This is important. Contact a health care provider if:  Your symptoms change or get worse, especially after having a  fever or infection. Get help right away if:  You have trouble breathing. Summary  Myasthenia gravis (MG) is a long-term (chronic) condition that causes weakness in the muscles you can control (voluntary muscles).  A symptom of MG is  muscle weakness that gets worse with activity and gets better after rest.  Sudden, severe difficulty breathing (myasthenic crisis) may develop after having an infection, a fever, or a bad reaction to a medicine.  The goal of treatment is to improve muscle strength. Treatment may include medicines, lifestyle changes, physical therapy, surgery, plasmapheresis, or emergency breathing support. This information is not intended to replace advice given to you by your health care provider. Make sure you discuss any questions you have with your health care provider. Document Released: 09/11/2000 Document Revised: 06/18/2017 Document Reviewed: 06/18/2017 Elsevier Interactive Patient Education  2019 [employer]

## 2018-06-27 NOTE — Telephone Encounter (Signed)
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 Let me know, thanks

## 2018-07-01 ENCOUNTER — Ambulatory Visit
Admission: RE | Admit: 2018-07-01 | Discharge: 2018-07-01 | Disposition: A | Discharge status: Home / Self Care | Payer: Medicare Other | Source: Ambulatory Visit | Attending: Neurology | Admitting: Neurology

## 2018-07-01 ENCOUNTER — Telehealth: Payer: Self-pay | Admitting: Neurology

## 2018-07-01 DIAGNOSIS — H02402 Unspecified ptosis of left eyelid: Secondary | ICD-10-CM

## 2018-07-01 DIAGNOSIS — I671 Cerebral aneurysm, nonruptured: Secondary | ICD-10-CM

## 2018-07-01 DIAGNOSIS — H499 Unspecified paralytic strabismus: Secondary | ICD-10-CM

## 2018-07-01 DIAGNOSIS — H4902 Third [oculomotor] nerve palsy, left eye: Secondary | ICD-10-CM

## 2018-07-01 MED ORDER — IOPAMIDOL (ISOVUE-370) INJECTION 76%
75.0000 mL | Freq: Once | INTRAVENOUS | Status: AC | PRN
  Administered 2018-07-01: 75 mL via INTRAVENOUS

## 2018-07-01 NOTE — Telephone Encounter (Signed)
I have sent to referral to [facility] for apt. Apt for Ocular Myasthenia Gravis . Chart has been sent for Nurse Review Telephone [phone] fax [phone] - I relayed to [facility] patient needed apt with in 4-6 weeks . Thanks Annabelle Harman.

## 2018-07-01 NOTE — Telephone Encounter (Signed)
I will . Thanks Annabelle Harman.

## 2018-07-01 NOTE — Telephone Encounter (Signed)
Thank you! Please follow up this week and if they get an appointment at [facility] I would cancel the appointment with the intention to transfer to [facility] to the neuromuscular experts there.

## 2018-07-01 NOTE — Telephone Encounter (Signed)
[employer] Medicare order sent to GI. No auth they will reach out to the pt to schedule.  °

## 2018-07-02 ENCOUNTER — Telehealth: Payer: Self-pay | Admitting: *Deleted

## 2018-07-02 NOTE — Telephone Encounter (Signed)
I was unable to get in touch directly with the patient.  However, I was able to speak with her daughter, Emily Mcclain (on [place]).  She is aware of the CTA and lab results.  She will let her mother and other siblings know the details.  States her mother will be agreeable to the [facility] referral.  She is aware they will reach out to them to schedule the appt.  I provided her with our call back number, in case of any questions.

## 2018-07-02 NOTE — Telephone Encounter (Signed)
-----   Message from Anson Fret, MD sent at 07/02/2018  7:59 AM EST ----- CTA of the head and neck were unremarkable, no aneurysms or blockages. However her labs for myasthenia gravis are coming back positive. We are trying to get her an appointment at [facility] with a neuromuscular expert but it appears her labs are consistent with myasthenia gravis. Please call patient as well as daughter to let them know thanks

## 2018-07-02 NOTE — Telephone Encounter (Signed)
Thank you :)

## 2018-07-02 NOTE — Telephone Encounter (Signed)
Called and spoke to the nurse and relayed labs were positive and they are working on apt for patient asap . I will keep patient updated with  all details about this apt. Nurse Review will contact me back direct. [phone] .

## 2018-07-03 ENCOUNTER — Telehealth: Payer: Self-pay | Admitting: *Deleted

## 2018-07-03 NOTE — Telephone Encounter (Signed)
Please make sure they have the most recent labs that show positive for myasthenia gravis antibodies thanks!

## 2018-07-03 NOTE — Telephone Encounter (Signed)
All Records Sent I made sure when I talked to [facility] They were scanned with CT , Labs Office Notes and [facility] is aware they will continue care.

## 2018-07-03 NOTE — Telephone Encounter (Signed)
[facility] is scheduled at [facility] on Friday 07/05/2017 arrive at 10:00 for 10:30 Patient will see Emily Mcclain For Ocular Myasthenia Gravis . Location . [address] [place] [address] . [address]. Telephone [phone] .  I spoke to daughter Emily Mcclain details . I called Emily Mcclain but she didn't answer left her a voice mail.   Emily Mcclain Asked me what the results of her mother's labs were and I relayed Emily DusterMichelle RN had already spoke to DanielsonJeanine with Results . Emily Mcclain got a little upset because her sister had not told her and her mother the results and began to call her sister on the three way with me on the line . I was uncomfortable at this point and told Emily Mcclain That I need to finish giving her information of her mother's apt that I was not clinical and I handle the refferals. Emily Mcclain relayed to me to just give her the telephone number and [facility] . I asked her again if she would like the  address she relayed she didn't want  she would call.   I have talked to Dr. Lucia GaskinsAhern Patient will continue her [facility] with [facility] CX her up coming apt.   Apt Has been CX.

## 2018-07-03 NOTE — Telephone Encounter (Signed)
Spoke

## 2018-07-03 NOTE — Telephone Encounter (Signed)
-----   Message from Anson Fret, MD sent at 07/02/2018  8:56 PM EST ----- Her labs for myasthenia gravis have come back positive, she has Myasthenia Gravis. We can discuss at appointment we have scheduled. We are also trying to get them an appointment at [facility] with a Myasthenia Gravis expert. Please let patient know and also call daughter on the [facility]. If they ask any questions you can defer them to the appointment I already scheduled with them. At last appointment, we discussed if labs came back positive for myasthenia gravis that we would discuss in detail (treatment, prognosis etc) at next appointment which we already set up in the very near future. If she gets into [facility] in the meantime she can go there instead.Thank you.

## 2018-07-03 NOTE — Telephone Encounter (Signed)
Thanks but the labs just recently came back in the last day or two, does it need to be resent?

## 2018-07-03 NOTE — Telephone Encounter (Signed)
noted 

## 2018-07-03 NOTE — Telephone Encounter (Addendum)
Notes from 07/02/2018.    07/02/18 12:10 PM  Note    I was unable to get in touch directly with the patient.  However, I was able to speak with her daughter, Dorian PodJeanine Willig ([place]  She is aware of the CTA and lab results.  She will let her mother and other siblings know the details.  States her mother will be agreeable to the [facility] referral.  She is aware they will reach out to them to schedule the appt.  I provided her with our call back number, in case of any questions.     ----- Message from Anson FretAntonia B Ahern, MD sent at 07/02/2018  7:59 AM EST ----- CTA of the head and neck were unremarkable, no aneurysms or blockages. However her labs for myasthenia gravis are coming back positive. We are [facility] to [facility]uscular expert but it appears her labs are consistent with myasthenia gravis. Please call patient as well as daughter to let them know thanks

## 2018-07-03 NOTE — Telephone Encounter (Signed)
I spoke to her daughter, Seerat Dun (on [place]), again today.  She is aware to keep to have her mother keep the pending follow up with Dr. Lucia Gaskins on 07/26/2018 to further discuss results.  She understands if the appt with Marilynne Drivers is sooner than this date, it is okay to just go to [place].  She verbalized understanding.

## 2018-07-04 LAB — COMPREHENSIVE METABOLIC PANEL
ALBUMIN: 4.3 g/dL (ref 3.5–4.8)
ALK PHOS: 82 IU/L (ref 39–117)
ALT: 12 IU/L (ref 0–32)
AST: 20 IU/L (ref 0–40)
Albumin/Globulin Ratio: 1.4 (ref 1.2–2.2)
BILIRUBIN TOTAL: 0.2 mg/dL (ref 0.0–1.2)
BUN/Creatinine Ratio: 13 (ref 12–28)
BUN: 15 mg/dL (ref 8–27)
CO2: 28 mmol/L (ref 20–29)
CREATININE: 1.18 mg/dL — AB (ref 0.57–1.00)
Calcium: 10.3 mg/dL (ref 8.7–10.3)
Chloride: 99 mmol/L (ref 96–106)
GFR, EST AFRICAN AMERICAN: 51 mL/min/{1.73_m2} — AB (ref >59)
GFR, EST NON AFRICAN AMERICAN: 45 mL/min/{1.73_m2} — AB (ref >59)
GLOBULIN, TOTAL: 3 g/dL (ref 1.5–4.5)
Glucose: 84 mg/dL (ref 65–99)
Potassium: 4.1 mmol/L (ref 3.5–5.2)
Sodium: 140 mmol/L (ref 134–144)
Total Protein: 7.3 g/dL (ref 6.0–8.5)

## 2018-07-04 LAB — CBC
HEMATOCRIT: 36.3 % (ref 34.0–46.6)
HEMOGLOBIN: 11.8 g/dL (ref 11.1–15.9)
MCH: 31.3 pg (ref 26.6–33.0)
MCHC: 32.5 g/dL (ref 31.5–35.7)
MCV: 96 fL (ref 79–97)
Platelets: 284 10*3/uL (ref 150–450)
RBC: 3.77 x10E6/uL (ref 3.77–5.28)
RDW: 13.1 % (ref 11.7–15.4)
WBC: 5.3 10*3/uL (ref 3.4–10.8)

## 2018-07-04 LAB — ACETYLCHOLINE RECEPTOR, BINDING: ACHR BINDING AB, SERUM: 0.9 nmol/L — AB (ref 0.00–0.24)

## 2018-07-04 LAB — ACETYLCHOLINE RECEPTOR, BLOCKING: Acetylchol Block Ab: 31 % — ABNORMAL HIGH (ref 0–25)

## 2018-07-04 LAB — ACETYLCHOLINE RECEPTOR, MODULATING: Acetylcholine Modulat Ab: <12 % (ref 0–20)

## 2018-07-04 NOTE — Telephone Encounter (Signed)
I sent labs from 06/27/2018 we are good.

## 2018-07-25 ENCOUNTER — Ambulatory Visit: Payer: Self-pay | Admitting: Neurology

## 2018-07-26 ENCOUNTER — Ambulatory Visit: Payer: Self-pay | Admitting: Neurology

## 2018-09-03 ENCOUNTER — Other Ambulatory Visit: Payer: Self-pay | Admitting: Internal Medicine

## 2018-09-03 DIAGNOSIS — Z1231 Encounter for screening mammogram for malignant neoplasm of breast: Secondary | ICD-10-CM

## 2018-09-04 NOTE — Progress Notes (Deleted)
Subjective:   Emily Mcclain, female    DOB: March 12, 1941, 78 y.o.   MRN: [id number]  No chief complaint on file.    PCP OV note 09/02/2018: patient has been diagnosed with Myastenia Gravis, per neurologist. Started on Pyridostigmine and prednisone.  Patient has Asthma flare recently. Went to urgent care. Pt was given albuterol inhaler as a rescue. Pt has mild exertional dyspnea with wheezing. Has one pillow orthopnea with some snoring.   Patient referred by Rogers Blocker for ***  HPI:***   Past Medical History:  Diagnosis Date  . Allergic rhinitis   . Anemia   . Asthma   . Generalized headaches   . H/O myasthenia gravis    not confirmed per daughter  . Hypertension   . Obesity (BMI 30.0-34.9)     Past Surgical History:  Procedure Laterality Date  . ABDOMINAL HYSTERECTOMY    . BUNIONECTOMY Left     Family History  Problem Relation Age of Onset  . Sudden death Father     Social History   Socioeconomic History  . Marital status: Married    Spouse name: Not on file  . Number of children: 4  . Years of education: 41  . Highest education level: Some college, no degree  Occupational History  . Not on file  Social Needs  . Financial resource strain: Not on file  . Food insecurity:    Worry: Not on file    Inability: Not on file  . Transportation needs:    Medical: Not on file    Non-medical: Not on file  Tobacco Use  . Smoking status: Never Smoker  . Smokeless tobacco: Never Used  Substance and Sexual Activity  . Alcohol use: No  . Drug use: No  . Sexual activity: Not on file  Lifestyle  . Physical activity:    Days per week: Not on file    Minutes per session: Not on file  . Stress: Not on file  Relationships  . Social connections:    Talks on phone: Not on file    Gets together: Not on file    Attends religious service: Not on file    Active member of club or organization: Not on file    Attends meetings of clubs or organizations: Not on file     Relationship status: Not on file  . Intimate partner violence:    Fear of current or ex partner: Not on file    Emotionally abused: Not on file    Physically abused: Not on file    Forced sexual activity: Not on file  Other Topics Concern  . Not on file  Social History Narrative   Lives alone, spends most of her time with her daughters   Right handed   Caffeine: no    Current Outpatient Medications on File Prior to Visit  Medication Sig Dispense Refill  . Cholecalciferol (VITAMIN D3 PO) Take 1,000 Int'l Units by mouth daily.    Marland Kitchen diltiazem (CARTIA XT) 180 MG 24 hr capsule Take 180 mg by mouth daily.    Marland Kitchen triamterene-hydrochlorothiazide (DYAZIDE) 37.5-25 MG capsule Take 1 capsule by mouth daily.     No current facility-administered medications on file prior to visit.      Review of Systems  Constitution: Negative for decreased appetite, malaise/fatigue, weight gain and weight loss.  Eyes: Negative for visual disturbance.  Cardiovascular: Negative for chest pain, claudication, dyspnea on exertion, leg swelling, orthopnea, palpitations and  syncope.  Respiratory: Negative for hemoptysis and wheezing.   Endocrine: Negative for cold intolerance and heat intolerance.  Hematologic/Lymphatic: Does not bruise/bleed easily.  Skin: Negative for nail changes.  Musculoskeletal: Negative for muscle weakness and myalgias.  Gastrointestinal: Negative for abdominal pain, change in bowel habit, nausea and vomiting.  Neurological: Negative for difficulty with concentration, dizziness, focal weakness and headaches.  Psychiatric/Behavioral: Negative for altered mental status and suicidal ideas.  All other systems reviewed and are negative.      Objective:     There were no vitals taken for this visit.  Physical Exam  Constitutional: She appears well-developed and well-nourished. No distress.  HENT:  Head: Atraumatic.  Eyes: Conjunctivae are normal.  Neck: Neck supple. No JVD present.  No thyromegaly present.  Cardiovascular: Normal rate, regular rhythm, normal heart sounds and intact distal pulses. Exam reveals no gallop.  No murmur heard. Pulmonary/Chest: Effort normal and breath sounds normal.  Abdominal: Soft. Bowel sounds are normal.  Musculoskeletal: Normal range of motion.        General: No edema.  Neurological: She is alert.  Skin: Skin is warm and dry.  Psychiatric: She has a normal mood and affect.      Cardiac studies:   CT Angio Head 07/01/2018: 1. Patent intracranial arterial circulation without evidence of major branch occlusion, significant stenosis, or aneurysm. 2. 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   MR Brain 03/10/2018: No acute intracranial abnormality. Moderate for age signal changes in the cerebral white matter are nonspecific, but most commonly due to chronic small vessel disease.Otherwise normal for age MRI appearance of the brain.  EKG 03/15/2017: Normal SR. Normal EKG.   Myocardial Perfusion Imaging 07/09/2010: No evidence of myocardial ischemia or infarction. Normal wall motion.  QGSEF 72 percent.    Recent Labs:***  CMP Latest Ref Rng & Units 06/27/2018  Glucose 65 - 99 mg/dL 84  BUN 8 - 27 mg/dL 15  Creatinine 0.57 - 1.00 mg/dL 1.18(H)  Sodium 134 - 144 mmol/L 140  Potassium 3.5 - 5.2 mmol/L 4.1  Chloride 96 - 106 mmol/L 99  CO2 20 - 29 mmol/L 28  Calcium 8.7 - 10.3 mg/dL 10.3  Total Protein 6.0 - 8.5 g/dL 7.3  Total Bilirubin 0.0 - 1.2 mg/dL 0.2  Alkaline Phos 39 - 117 IU/L 82  AST 0 - 40 IU/L 20  ALT 0 - 32 IU/L 12  eGFR Af Amer  >42m/min 51(L)   CBC Latest Ref Rng & Units 06/27/2018  WBC 3.4 - 10.8 x10E3/uL 5.3  Hemoglobin 11.1 - 15.9 g/dL 11.8  Hematocrit 34.0 - 46.6 % 36.3  Platelets 150 - 450 x10E3/uL 284   Lipid Panel     Component Value Date/Time   CHOL 190 08/07/2012   TRIG 52 08/07/2012   HDL 61 08/07/2012   LDLCALC 119 08/07/2012       Assessment &  Recommendations:   There are no diagnoses linked to this encounter.   Thank you for referring this patient. Please do not hesitate to contact me for any questions.   AJeri Lager MSN, APRN, FNP-C [facility], PE[place]ahOffice: [phone]: [phone]

## 2018-09-05 ENCOUNTER — Ambulatory Visit: Payer: Medicare Other | Admitting: Cardiology

## 2018-09-05 ENCOUNTER — Other Ambulatory Visit: Payer: Self-pay

## 2018-09-05 ENCOUNTER — Ambulatory Visit: Payer: Self-pay | Admitting: Cardiology

## 2018-09-05 ENCOUNTER — Ambulatory Visit: Payer: Medicare Other

## 2018-09-05 ENCOUNTER — Encounter: Payer: Self-pay | Admitting: Cardiology

## 2018-09-05 VITALS — BP 149/76 | HR 65 | Ht 60.0 in | Wt 141.0 lb

## 2018-09-05 DIAGNOSIS — R002 Palpitations: Secondary | ICD-10-CM

## 2018-09-05 DIAGNOSIS — I10 Essential (primary) hypertension: Secondary | ICD-10-CM

## 2018-09-05 DIAGNOSIS — R0602 Shortness of breath: Secondary | ICD-10-CM

## 2018-09-05 NOTE — Progress Notes (Signed)
Subjective:   Emily Mcclain, female    DOB: Oct 16, 1940, 78 y.o.   MRN: [id number]  Chief Complaint  Patient presents with  . New Patient (Initial Visit)    SVT     Patient referred by Gwenyth Bender for palpitations.  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 Docia Furl at [facility].  Patient reports a few days ago she had an episode that awoke her in the middle of the night around 3 AM where her heart suddenly increased to around 160 bpm for approximately 5 minutes. She denies any lightheadedness. States that she had the fear of a heart attack and chest pressure. Heart rate gradually improved before EMS arrived. Has not had any reoccurrence since.  She reports the night before that she had a pain in her back that radiated around to her chest and improved with belching. Has some shortness of breath with over exertion. She was seen in ER for chest tightness, coughing, and shortness of breath that was felt to be related to bronchitis and was started on inhalers that did improve her symptoms. She still feels that she is recuperating from this.  She does not exercise, but is planning to start home exercise regimen soon.  Past Medical History:  Diagnosis Date  . Allergic rhinitis   . Anemia   . Asthma   . Generalized headaches   . H/O myasthenia gravis    not confirmed per daughter  . Hypertension   . Obesity (BMI 30.0-34.9)     Past Surgical History:  Procedure Laterality Date  . ABDOMINAL HYSTERECTOMY    . BUNIONECTOMY Left     Family History  Problem Relation Age of Onset  . Sudden death Father   . Prostate cancer Brother   . Prostate cancer Brother   . Prostate cancer Brother     Social History   Socioeconomic History  . Marital status: Widowed    Spouse name: Not on file  . Number of children: 4  . Years of education: 65  . Highest education level:  Some college, no degree  Occupational History  . Not on file  Social Needs  . Financial resource strain: Not on file  . Food insecurity:    Worry: Not on file    Inability: Not on file  . Transportation needs:    Medical: Not on file    Non-medical: Not on file  Tobacco Use  . Smoking status: Never Smoker  . Smokeless tobacco: Never Used  Substance and Sexual Activity  . Alcohol use: No  . Drug use: No  . Sexual activity: Not on file  Lifestyle  . Physical activity:    Days per week: Not on file    Minutes per session: Not on file  . Stress: Not on file  Relationships  . Social connections:    Talks on phone: Not on file    Gets together: Not on file    Attends religious service: Not on file    Active member of club or organization: Not on file    Attends meetings of clubs or organizations: Not on file    Relationship status: Not on file  . Intimate partner violence:    Fear of current or ex partner: Not on file    Emotionally abused: Not on file    Physically abused: Not on file    Forced sexual activity:  Not on file  Other Topics Concern  . Not on file  Social History Narrative   Lives alone, spends most of her time with her daughters   Right handed   Caffeine: no    Current Outpatient Medications on File Prior to Visit  Medication Sig Dispense Refill  . albuterol (PROVENTIL HFA;VENTOLIN HFA) 108 (90 Base) MCG/ACT inhaler as needed.    . baclofen (LIORESAL) 10 MG tablet Take 10 mg by mouth 3 (three) times daily.    . Cholecalciferol (VITAMIN D3 PO) Take 5,000 Int'l Units by mouth daily.     Marland Kitchen diltiazem (CARTIA XT) 180 MG 24 hr capsule Take 180 mg by mouth daily.    Marland Kitchen FLOVENT HFA 110 MCG/ACT inhaler Occasionally, not taking as prescribed    . omeprazole (PRILOSEC) 20 MG capsule Take 1 capsule by mouth daily.    . predniSONE (DELTASONE) 10 MG tablet Take by mouth daily.    Marland Kitchen triamterene-hydrochlorothiazide (DYAZIDE) 37.5-25 MG capsule Take 1 capsule by mouth  daily.     No current facility-administered medications on file prior to visit.      Review of Systems  Constitution: Negative for decreased appetite, malaise/fatigue, weight gain and weight loss.  Eyes: Positive for visual disturbance.  Cardiovascular: Positive for palpitations. Negative for chest pain, claudication, dyspnea on exertion, leg swelling, orthopnea and syncope.  Respiratory: Negative for hemoptysis and wheezing.   Endocrine: Negative for cold intolerance and heat intolerance.  Hematologic/Lymphatic: Does not bruise/bleed easily.  Skin: Negative for nail changes.  Musculoskeletal: Negative for muscle weakness and myalgias.  Gastrointestinal: Negative for abdominal pain, change in bowel habit, nausea and vomiting.  Neurological: Negative for difficulty with concentration, dizziness, focal weakness and headaches.  Psychiatric/Behavioral: Negative for altered mental status and suicidal ideas.  All other systems reviewed and are negative.      Objective:     Blood pressure (!) 149/76, pulse 65, height 5' (1.524 m), weight 141 lb (64 kg), SpO2 98 %.  Physical Exam  Constitutional: She is oriented to person, place, and time. Vital signs are normal. She appears well-developed and well-nourished.  HENT:  Head: Normocephalic and atraumatic.  Neck: Normal range of motion.  Cardiovascular: Normal rate, regular rhythm, normal heart sounds and intact distal pulses.  Pulmonary/Chest: Effort normal and breath sounds normal. No accessory muscle usage. No respiratory distress.  Abdominal: Soft. Bowel sounds are normal. She exhibits no distension. There is no abdominal tenderness.  Musculoskeletal: Normal range of motion.        General: No edema.  Neurological: She is alert and oriented to person, place, and time.  Skin: Skin is warm and dry.  Psychiatric: She has a normal mood and affect.  Vitals reviewed.    Recent Labs:  CBC    Component Value Date/Time   WBC 5.3  06/27/2018 1506   RBC 3.77 06/27/2018 1506   HGB 11.8 06/27/2018 1506   HCT 36.3 06/27/2018 1506   PLT 284 06/27/2018 1506   MCV 96 06/27/2018 1506   MCH 31.3 06/27/2018 1506   MCHC 32.5 06/27/2018 1506   RDW 13.1 06/27/2018 1506   CMP     Component Value Date/Time   NA 140 06/27/2018 1506   K 4.1 06/27/2018 1506   CL 99 06/27/2018 1506   CO2 28 06/27/2018 1506   GLUCOSE 84 06/27/2018 1506   BUN 15 06/27/2018 1506   CREATININE 1.18 (H) 06/27/2018 1506   CALCIUM 10.3 06/27/2018 1506   PROT 7.3 06/27/2018 1506  ALBUMIN 4.3 06/27/2018 1506   AST 20 06/27/2018 1506   ALT 12 06/27/2018 1506   ALKPHOS 82 06/27/2018 1506   BILITOT 0.2 06/27/2018 1506   GFRNONAA 45 (L) 06/27/2018 1506   GFRAA 51 (L) 06/27/2018 1506          Assessment & Recommendations:  1. Palpitations Symptoms are concerning for possible SVT: however, could also be A fib. Will place her on 30 day event monitor for further evaluation. She has not had any reoccurrence since her inital episode.Would recommend obtaining TSH level if not already performed.  2. Essential hypertension Elevated in our office today: however, patient states that her readings are up and down. Will reevaluate at her next office visit. No changes to medications today.  3. Shortness of breath Suspect related to deconditioning. Given her recent symptoms, will further evaluate with echocardiogram to exclude structural abnormalities and routine treadmill stress test to see if she has any exercise induced arrhythmias. Physical exam and EKG are unremarkable.  Plan: I will see her back after the test for further recommendations and reevaluation.   Thank you for referring this patient. Please do not hesitate to contact me for any questions.    *I have discussed this case with Dr. Jacinto Halim and he personally examined the patient and participated in formulating the plan.Altamese Lycoming, MSN, APRN, FNP-C [facility] Office:  [phone] Fax: [phone]

## 2018-09-08 ENCOUNTER — Encounter: Payer: Self-pay | Admitting: Cardiology

## 2018-09-08 DIAGNOSIS — I10 Essential (primary) hypertension: Secondary | ICD-10-CM | POA: Insufficient documentation

## 2018-09-08 DIAGNOSIS — R0602 Shortness of breath: Secondary | ICD-10-CM | POA: Insufficient documentation

## 2018-09-23 ENCOUNTER — Other Ambulatory Visit: Payer: Medicare Other

## 2018-10-04 DIAGNOSIS — R002 Palpitations: Secondary | ICD-10-CM | POA: Diagnosis not present

## 2018-10-14 ENCOUNTER — Ambulatory Visit: Payer: Medicare Other | Admitting: Cardiology

## 2018-10-14 ENCOUNTER — Ambulatory Visit: Payer: Medicare Other

## 2018-11-04 ENCOUNTER — Other Ambulatory Visit: Payer: Self-pay

## 2018-11-04 ENCOUNTER — Ambulatory Visit (INDEPENDENT_AMBULATORY_CARE_PROVIDER_SITE_OTHER): Payer: Medicare Other

## 2018-11-04 DIAGNOSIS — R0602 Shortness of breath: Secondary | ICD-10-CM

## 2018-11-13 ENCOUNTER — Encounter: Payer: Self-pay | Admitting: Cardiology

## 2018-11-13 ENCOUNTER — Ambulatory Visit (INDEPENDENT_AMBULATORY_CARE_PROVIDER_SITE_OTHER): Payer: Medicare Other | Admitting: Cardiology

## 2018-11-13 ENCOUNTER — Other Ambulatory Visit: Payer: Self-pay

## 2018-11-13 VITALS — BP 133/68 | HR 81 | Ht 60.0 in | Wt 142.0 lb

## 2018-11-13 DIAGNOSIS — G7000 Myasthenia gravis without (acute) exacerbation: Secondary | ICD-10-CM

## 2018-11-13 DIAGNOSIS — I10 Essential (primary) hypertension: Secondary | ICD-10-CM

## 2018-11-13 DIAGNOSIS — R0609 Other forms of dyspnea: Secondary | ICD-10-CM

## 2018-11-13 DIAGNOSIS — R002 Palpitations: Secondary | ICD-10-CM

## 2018-11-13 NOTE — Progress Notes (Signed)
Primary Physician:  Gwenyth Bender, MD   Patient ID: Emily Mcclain, female    DOB: 06-10-1941, 77 y.o.   MRN: [id number]  Subjective:    Chief Complaint  Patient presents with  . Shortness of Breath  . Palpitations  . Results    echo, monitor  . Follow-up   This visit type was conducted due to national recommendations for restrictions regarding the COVID-19 Pandemic (e.g. social distancing).  This format is felt to be most appropriate for this patient at this time.  All issues noted in this document were discussed and addressed.  No physical exam was performed (except for noted visual exam findings with Telehealth visits).  The patient has consented to conduct a Telehealth visit and understands insurance will be billed.   I discussed the limitations of evaluation and management by telemedicine and the availability of in person appointments. The patient expressed understanding and agreed to proceed.  Virtual Visit via Video Note is as below  I connected with Emily Mcclain, on 11/13/2018 at 1330 by a video enabled telemedicine application and verified that I am speaking with the correct person using two identifiers.     I have discussed with her regarding the safety during COVID Pandemic and steps and precautions including social distancing with the patient.    HPI: Emily Mcclain  is a 78 y.o. 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 Docia Furl at [facility].  Patient was last seen a few months ago after she had an episode of heart racing that had awoken her from sleep.  She had reportedly had an episode of pain in her back that radiated around her chest and improved with belching she was seen in the emergency room and symptoms were felt to be related to bronchitis, was given inhalers that did improve her symptoms.  She does have shortness of breath on exertion particularly with  climbing stairs.  She has recently had changes made to her inhalers that has helped her shortness of breath.  She underwent echocardiogram and 30-day event monitor and presents discuss results.  Treadmill stress test was ordered; however, was not performed in view of COVID.  She has recently seen her neurologist. Diplopia has resolved with prednisone.   She does not exercise, but is planning to start home exercise regimen soon.  Past Medical History:  Diagnosis Date  . Allergic rhinitis   . Anemia   . Asthma   . Generalized headaches   . H/O myasthenia gravis    not confirmed per daughter  . Hypertension   . Obesity (BMI 30.0-34.9)     Past Surgical History:  Procedure Laterality Date  . ABDOMINAL HYSTERECTOMY    . BUNIONECTOMY Left     Social History   Socioeconomic History  . Marital status: Widowed    Spouse name: Not on file  . Number of children: 4  . Years of education: 28  . Highest education level: Some college, no degree  Occupational History  . Not on file  Social Needs  . Financial resource strain: Not on file  . Food insecurity:    Worry: Not on file    Inability: Not on file  . Transportation needs:    Medical: Not on file    Non-medical: Not on file  Tobacco Use  . Smoking status: Never Smoker  . Smokeless tobacco: Never Used  Substance and Sexual Activity  .  Alcohol use: No  . Drug use: No  . Sexual activity: Not on file  Lifestyle  . Physical activity:    Days per week: Not on file    Minutes per session: Not on file  . Stress: Not on file  Relationships  . Social connections:    Talks on phone: Not on file    Gets together: Not on file    Attends religious service: Not on file    Active member of club or organization: Not on file    Attends meetings of clubs or organizations: Not on file    Relationship status: Not on file  . Intimate partner violence:    Fear of current or ex partner: Not on file    Emotionally abused: Not on file     Physically abused: Not on file    Forced sexual activity: Not on file  Other Topics Concern  . Not on file  Social History Narrative   Lives alone, spends most of her time with her daughters   Right handed   Caffeine: no    Review of Systems  Constitution: Negative for decreased appetite, malaise/fatigue, weight gain and weight loss.  Eyes: Negative for visual disturbance.  Cardiovascular: Positive for dyspnea on exertion. Negative for chest pain, claudication, leg swelling, orthopnea, palpitations and syncope.  Respiratory: Negative for hemoptysis and wheezing.   Endocrine: Negative for cold intolerance and heat intolerance.  Hematologic/Lymphatic: Does not bruise/bleed easily.  Skin: Negative for nail changes.  Musculoskeletal: Negative for muscle weakness and myalgias.  Gastrointestinal: Negative for abdominal pain, change in bowel habit, nausea and vomiting.  Neurological: Negative for difficulty with concentration, dizziness, focal weakness and headaches.  Psychiatric/Behavioral: Negative for altered mental status and suicidal ideas.  All other systems reviewed and are negative.     Objective:  Blood pressure 133/68, pulse 81, height 5' (1.524 m), weight 142 lb (64.4 kg). Body mass index is 27.73 kg/m.  Physical exam not performed or limited due to virtual visit.  Patient appeared to be in no distress, Neck was supple, respiration was not labored.  Please see exam details from prior visit is as below.     Physical Exam  Constitutional: She is oriented to person, place, and time. Vital signs are normal. She appears well-developed and well-nourished.  HENT:  Head: Normocephalic and atraumatic.  Neck: Normal range of motion.  Cardiovascular: Normal rate, regular rhythm, normal heart sounds and intact distal pulses.  Pulmonary/Chest: Effort normal and breath sounds normal. No accessory muscle usage. No respiratory distress.  Abdominal: Soft. Bowel sounds are normal. She  exhibits no distension. There is no abdominal tenderness.  Musculoskeletal: Normal range of motion.        General: No edema.  Neurological: She is alert and oriented to person, place, and time.  Skin: Skin is warm and dry.  Psychiatric: She has a normal mood and affect.  Vitals reviewed.  Radiology: No results found.  Laboratory examination:    CMP Latest Ref Rng & Units 06/27/2018 08/07/2012  Glucose 65 - 99 mg/dL 84 -  BUN 8 - 27 mg/dL 15 -  Creatinine 3.08 - 1.00 mg/dL 6.57(Q) -  Sodium 469 - 144 mmol/L 140 -  Potassium 3.5 - 5.2 mmol/L 4.1 3.8  Chloride 96 - 106 mmol/L 99 -  CO2 20 - 29 mmol/L 28 -  Calcium 8.7 - 10.3 mg/dL 62.9 -  Total Protein 6.0 - 8.5 g/dL 7.3 -  Total Bilirubin 0.0 - 1.2 mg/dL 0.2 -  Alkaline Phos 39 - 117 IU/L 82 -  AST 0 - 40 IU/L 20 -  ALT 0 - 32 IU/L 12 -   CBC Latest Ref Rng & Units 06/27/2018 08/07/2012  WBC 3.4 - 10.8 x10E3/uL 5.3 5.4  Hemoglobin 11.1 - 15.9 g/dL 81.111.8 11.4(A)  Hematocrit 34.0 - 46.6 % 36.3 34(A)  Platelets 150 - 450 x10E3/uL 284 -   Lipid Panel     Component Value Date/Time   CHOL 190 08/07/2012   TRIG 52 08/07/2012   HDL 61 08/07/2012   LDLCALC 119 08/07/2012   HEMOGLOBIN A1C No results found for: HGBA1C, MPG TSH No results for input(s): TSH in the last 8760 hours.  PRN Meds:. Medications Discontinued During This Encounter  Medication Reason  . FLOVENT HFA 110 MCG/ACT inhaler Change in therapy  . baclofen (LIORESAL) 10 MG tablet Completed Course   Current Meds  Medication Sig  . albuterol (PROVENTIL HFA;VENTOLIN HFA) 108 (90 Base) MCG/ACT inhaler as needed.  . Cholecalciferol (VITAMIN D3 PO) Take 5,000 Int'l Units by mouth daily.   Marland Kitchen. diltiazem (CARTIA XT) 180 MG 24 hr capsule Take 180 mg by mouth daily.  Marland Kitchen. levalbuterol (XOPENEX) 0.63 MG/3ML nebulizer solution as needed.  . Mometasone Furoate (ASMANEX HFA) 200 MCG/ACT AERO Inhale into the lungs daily.  Marland Kitchen. omeprazole (PRILOSEC) 20 MG capsule Take 1 capsule by  mouth daily.  . predniSONE (DELTASONE) 20 MG tablet Take 40 mg by mouth daily. Qod alternating with 5mg   . predniSONE (DELTASONE) 5 MG tablet qod  . SPIRIVA RESPIMAT 1.25 MCG/ACT AERS INHALE 1 PUFF BY MOUTH EVERY DAY  . triamterene-hydrochlorothiazide (DYAZIDE) 37.5-25 MG capsule Take 1 capsule by mouth daily.    Cardiac Studies:   Echocardiogram 11/04/2018: Normal LV systolic function with EF 56%. Left ventricle cavity is normal in size. Normal global wall motion. Normal diastolic filling pattern. Calculated EF 56%. Mild (Grade I) mitral regurgitation. Mild to moderate tricuspid regurgitation. Estimated pulmonary artery systolic pressure 34 mmHg. RVSP measures 34 mmHg.  30 day event monitor 03/19-04/17/2020: Normal sinus rhythm. 3 patient triggered events without reported symptoms occurred correlating with sinus rhythm. No A fib or SVT was noted.   Assessment:   Palpitations  Dyspnea on exertion  Essential hypertension  Myasthenia gravis, AChR antibody positive (HCC)  EKG 09/05/2018: Normal sinus rhythm at 60 bpm, normal axis, no evidence of ischemia.  Recommendations:   Since last seen by me, patient has not had any recurrence of palpitations or chest pain.  I discussed recently obtained 30-day event monitoring that showed normal sinus rhythm.  Previous episode was concerning for possible SVT or atrial fibrillation, will continue with watchful waiting.  She does have dyspnea on exertion that she states has improved slightly with inhalers and prednisone therapy.  Per her neurology note, CT scan of the chest has been recommended and will be obtained by her PCP.  I discussed her recently obtained echocardiogram that showed normal LVEF, no significant valvular abnormalities.  PA pressure borderline elevated at 34 mmHg.  I suspect her dyspnea on exertion is possibly related to deconditioning as well as possibly related to her myasthenia gravis.  I have encouraged her to start regular  exercise to see if her shortness of breath will improve.  Blood pressure is well controlled with current medical therapy.  Do not feel that she needs stress testing at this time as she is without symptoms of chest pain, but will reconsider if she continues to have shortness of breath.  We will  see her back in 3 months for follow-up on her symptoms.  No changes were made to medications today.  Toniann Fail, MSN, APRN, FNP-C [facility] Office: [phone] Fax: [phone]

## 2018-11-14 ENCOUNTER — Encounter: Payer: Self-pay | Admitting: Cardiology

## 2018-11-28 ENCOUNTER — Ambulatory Visit: Payer: Medicare Other

## 2019-01-28 ENCOUNTER — Ambulatory Visit: Payer: Medicare Other

## 2019-02-17 ENCOUNTER — Other Ambulatory Visit: Payer: Self-pay

## 2019-02-17 ENCOUNTER — Encounter: Payer: Self-pay | Admitting: Cardiology

## 2019-02-17 ENCOUNTER — Ambulatory Visit (INDEPENDENT_AMBULATORY_CARE_PROVIDER_SITE_OTHER): Payer: Medicare Other | Admitting: Cardiology

## 2019-02-17 VITALS — BP 130/80 | HR 83 | Ht 60.0 in | Wt 151.0 lb

## 2019-02-17 DIAGNOSIS — I10 Essential (primary) hypertension: Secondary | ICD-10-CM | POA: Diagnosis not present

## 2019-02-17 DIAGNOSIS — I361 Nonrheumatic tricuspid (valve) insufficiency: Secondary | ICD-10-CM

## 2019-02-17 DIAGNOSIS — R002 Palpitations: Secondary | ICD-10-CM | POA: Diagnosis not present

## 2019-02-17 DIAGNOSIS — R0609 Other forms of dyspnea: Secondary | ICD-10-CM | POA: Diagnosis not present

## 2019-02-17 NOTE — Progress Notes (Signed)
Primary Physician:  Gwenyth Benderean, Eric L, MD   Patient ID: Emily Mcclain, female    DOB: 03/08/1941, 78 y.o.   MRN: [id number]  Subjective:    Chief Complaint  Patient presents with  . hx myasthenia gravis  . DOE   HPI: Emily Oullen M Guilbert  is a 78 y.o. 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.  She has recently seen her neurologist. Diplopia has resolved with prednisone. Her past medical history significant for hypertension.  She presents here for follow-up of dyspnea on exertion.  Denies chest pain, has occasional palpitations.  Her daughter is present on the telephone while I saw the patient in no clinic.  Patient denies any PND or orthopnea, no leg edema, no recent significant weight change although she has gained about 3-4 pounds in the last 3 months.  Past Medical History:  Diagnosis Date  . Allergic rhinitis   . Anemia   . Asthma   . Generalized headaches   . H/O myasthenia gravis    not confirmed per daughter  . Hypertension   . Obesity (BMI 30.0-34.9)     Past Surgical History:  Procedure Laterality Date  . ABDOMINAL HYSTERECTOMY    . BUNIONECTOMY Left     Social History   Socioeconomic History  . Marital status: Widowed    Spouse name: Not on file  . Number of children: 4  . Years of education: 2114  . Highest education level: Some college, no degree  Occupational History  . Not on file  Social Needs  . Financial resource strain: Not on file  . Food insecurity    Worry: Not on file    Inability: Not on file  . Transportation needs    Medical: Not on file    Non-medical: Not on file  Tobacco Use  . Smoking status: Never Smoker  . Smokeless tobacco: Never Used  Substance and Sexual Activity  . Alcohol use: No  . Drug use: No  . Sexual activity: Not on file  Lifestyle  . Physical activity    Days per week: Not on file    Minutes  per session: Not on file  . Stress: Not on file  Relationships  . Social Musicianconnections    Talks on phone: Not on file    Gets together: Not on file    Attends religious service: Not on file    Active member of club or organization: Not on file    Attends meetings of clubs or organizations: Not on file    Relationship status: Not on file  . Intimate partner violence    Fear of current or ex partner: Not on file    Emotionally abused: Not on file    Physically abused: Not on file    Forced sexual activity: Not on file  Other Topics Concern  . Not on file  Social History Narrative   Lives alone, spends most of her time with her daughters   Right handed   Caffeine: no   Review of Systems  Constitution: Negative for decreased appetite, malaise/fatigue, weight gain and weight loss.  Eyes: Negative for visual disturbance.  Cardiovascular: Positive for dyspnea on exertion. Negative for chest pain, claudication, leg swelling, orthopnea, palpitations and syncope.  Respiratory: Negative for hemoptysis and wheezing.   Endocrine: Negative for cold intolerance and heat intolerance.  Hematologic/Lymphatic: Does not bruise/bleed easily.  Skin: Negative  for nail changes.  Musculoskeletal: Negative for muscle weakness and myalgias.  Gastrointestinal: Negative for abdominal pain, change in bowel habit, nausea and vomiting.  Neurological: Negative for difficulty with concentration, dizziness, focal weakness and headaches.  Psychiatric/Behavioral: Negative for altered mental status and suicidal ideas.  All other systems reviewed and are negative.  Objective:  Blood pressure 130/80, pulse 83, height 5' (1.524 m), weight 151 lb (68.5 kg), SpO2 96 %. Body mass index is 29.49 kg/m.    Physical Exam  Constitutional: She is oriented to person, place, and time. Vital signs are normal. She appears well-developed and well-nourished.  HENT:  Head: Normocephalic and atraumatic.  Neck: Normal range of  motion.  Cardiovascular: Normal rate, regular rhythm, normal heart sounds and intact distal pulses.  Pulmonary/Chest: Effort normal and breath sounds normal. No accessory muscle usage. No respiratory distress.  Abdominal: Soft. Bowel sounds are normal. She exhibits no distension. There is no abdominal tenderness.  Musculoskeletal: Normal range of motion.        General: No edema.  Neurological: She is alert and oriented to person, place, and time.  Skin: Skin is warm and dry.  Psychiatric: She has a normal mood and affect.  Vitals reviewed.  Radiology: No results found.  Laboratory examination:    CMP Latest Ref Rng & Units 06/27/2018 08/07/2012  Glucose 65 - 99 mg/dL 84 -  BUN 8 - 27 mg/dL 15 -  Creatinine 9.97 - 1.00 mg/dL 7.41(S) -  Sodium 239 - 144 mmol/L 140 -  Potassium 3.5 - 5.2 mmol/L 4.1 3.8  Chloride 96 - 106 mmol/L 99 -  CO2 20 - 29 mmol/L 28 -  Calcium 8.7 - 10.3 mg/dL 53.2 -  Total Protein 6.0 - 8.5 g/dL 7.3 -  Total Bilirubin 0.0 - 1.2 mg/dL 0.2 -  Alkaline Phos 39 - 117 IU/L 82 -  AST 0 - 40 IU/L 20 -  ALT 0 - 32 IU/L 12 -   CBC Latest Ref Rng & Units 06/27/2018 08/07/2012  WBC 3.4 - 10.8 x10E3/uL 5.3 5.4  Hemoglobin 11.1 - 15.9 g/dL 02.3 11.4(A)  Hematocrit 34.0 - 46.6 % 36.3 34(A)  Platelets 150 - 450 x10E3/uL 284 -   Lipid Panel     Component Value Date/Time   CHOL 190 08/07/2012   TRIG 52 08/07/2012   HDL 61 08/07/2012   LDLCALC 119 08/07/2012   HEMOGLOBIN A1C No results found for: HGBA1C, MPG TSH No results for input(s): TSH in the last 8760 hours.  PRN Meds:. Medications Discontinued During This Encounter  Medication Reason  . predniSONE (DELTASONE) 20 MG tablet Error  . omeprazole (PRILOSEC) 20 MG capsule Error   Current Meds  Medication Sig  . albuterol (PROVENTIL HFA;VENTOLIN HFA) 108 (90 Base) MCG/ACT inhaler as needed.  . Cholecalciferol (VITAMIN D3 PO) Take 5,000 Int'l Units by mouth daily.   Marland Kitchen diltiazem (CARTIA XT) 180 MG 24 hr  capsule Take 180 mg by mouth daily.  Marland Kitchen esomeprazole (NEXIUM) 20 MG capsule daily.  Marland Kitchen levalbuterol (XOPENEX) 0.63 MG/3ML nebulizer solution as needed.  . predniSONE (DELTASONE) 10 MG tablet 15 mg. qod  . SPIRIVA RESPIMAT 1.25 MCG/ACT AERS INHALE 1 PUFF BY MOUTH EVERY DAY  . triamterene-hydrochlorothiazide (DYAZIDE) 37.5-25 MG capsule Take 1 capsule by mouth daily.    Cardiac Studies:   Echocardiogram 11/04/2018: Normal LV systolic function with EF 56%. Left ventricle cavity is normal in size. Normal global wall motion. Normal diastolic filling pattern. Calculated EF 56%. Mild (Grade I) mitral  regurgitation. Mild to moderate tricuspid regurgitation. Estimated pulmonary artery systolic pressure 34 mmHg. RVSP measures 34 mmHg.  30 day event monitor 03/19-04/17/2020: Normal sinus rhythm. 3 patient triggered events without reported symptoms occurred correlating with sinus rhythm. No A fib or SVT was noted.   Assessment:   Dyspnea on exertion - Plan: EKG 12-Lead, PCV ECHOCARDIOGRAM COMPLETE  Essential hypertension  Palpitations  Nonrheumatic tricuspid valve regurgitation - Plan: PCV ECHOCARDIOGRAM COMPLETE  EKG 09/05/2018: Normal sinus rhythm at 60 bpm, normal axis, no evidence of ischemia.  Recommendations:    Her dyspnea related to mild obesity, myasthenia gravis and deconditioning, since being on steroids her symptoms improved.  Her weight is also stable although she has gained about 3-4 pounds in weight.  Blood pressure is well controlled.  Although she has mild pulmonary hypertension by echo cor exam, do not suspect primary pulmonary hypertension.  Encouraged her to increase her physical activity as tolerated.  Unless dyspnea gets worse, I would like to see her back in one year and I will repeat echocardiogram at that time to evaluate and follow-up on tricuspid regurgitation.  I spoke to her daughter over the telephone while patient was visiting as well.  No changes in the medications  were done today.  I do not suspect coronary artery disease, although treadmill stress test was ordered previously, as she is remained stable with regard to her symptoms, this was canceled.  Adrian Prows, MD, [facility] 02/17/2019, 10:30 AM [facility]. [place] Pager: [phone] Office: [phone] If no answer Cell [phone]

## 2019-03-10 ENCOUNTER — Other Ambulatory Visit: Payer: Self-pay

## 2019-03-10 ENCOUNTER — Ambulatory Visit
Admission: RE | Admit: 2019-03-10 | Discharge: 2019-03-10 | Disposition: A | Discharge status: Home / Self Care | Payer: Medicare Other | Source: Ambulatory Visit | Attending: Internal Medicine | Admitting: Internal Medicine

## 2019-03-10 ENCOUNTER — Ambulatory Visit: Payer: Medicare Other

## 2019-03-10 ENCOUNTER — Inpatient Hospital Stay: Admission: RE | Admit: 2019-03-10 | Payer: Medicare Other | Source: Ambulatory Visit

## 2019-03-10 DIAGNOSIS — Z1231 Encounter for screening mammogram for malignant neoplasm of breast: Secondary | ICD-10-CM

## 2019-04-14 ENCOUNTER — Encounter: Payer: Self-pay | Admitting: Critical Care Medicine

## 2019-04-24 ENCOUNTER — Ambulatory Visit: Payer: Medicare Other | Admitting: Critical Care Medicine

## 2019-04-24 ENCOUNTER — Other Ambulatory Visit: Payer: Self-pay

## 2019-04-24 ENCOUNTER — Encounter: Payer: Self-pay | Admitting: Critical Care Medicine

## 2019-04-24 VITALS — BP 120/60 | HR 93 | Temp 97.9°F | Ht 60.0 in | Wt 154.4 lb

## 2019-04-24 DIAGNOSIS — J4540 Moderate persistent asthma, uncomplicated: Secondary | ICD-10-CM | POA: Diagnosis not present

## 2019-04-24 DIAGNOSIS — D84821 Immunodeficiency due to drugs: Secondary | ICD-10-CM

## 2019-04-24 DIAGNOSIS — Z8669 Personal history of other diseases of the nervous system and sense organs: Secondary | ICD-10-CM

## 2019-04-24 DIAGNOSIS — Z7952 Long term (current) use of systemic steroids: Secondary | ICD-10-CM

## 2019-04-24 NOTE — Progress Notes (Signed)
Synopsis: Referred in November 2020 for asthma by Gwenyth Benderean, Eric L, MD.  Subjective:   PATIENT ID: Emily Mcclain GENDER: female DOB: 03/08/1941, MRN: [id number]  Chief Complaint  Patient presents with  . Pulmonary Consult    asthma    Emily Mcclain is a 78 year old woman who presents for evaluation of shortness of breath at the request of Emily Mcclain neurologist, who manages Emily Mcclain ocular myasthenia gravis.  Emily Mcclain is accompanied today by Emily Mcclain Emily Mcclain who Emily Mcclain lives with.  Emily Mcclain was diagnosed with asthma in 1975, but did well until around 2014 when Emily Mcclain asthma worsened.  Emily Mcclain has allergies to ragweed, but does not have associated breathing issues and seldom has symptoms.  Starting the spring Emily Mcclain symptoms of shortness of breath and wheezing started, which have improved with Spiriva and Flovent inhalers.  Emily Mcclain is using these as prescribed and rinses Emily Mcclain mouth after Emily Mcclain Flovent.  Emily Mcclain symptoms are exacerbated by fumes, strong smells, and chemical smells.  Currently Emily Mcclain is not limited in Emily Mcclain activity-Emily Mcclain can walk several blocks before stopping.  Emily Mcclain denies cough, sputum production, chest pain, or heartburn.  Emily Mcclain does not have nocturnal symptoms.  Emily Mcclain uses Emily Mcclain albuterol 3-4 times a day some days, but does this because Emily Mcclain feels like Emily Mcclain is supposed to, not because symptoms trigger Emily Mcclain to.  Emily Mcclain is on chronic prednisone-15 mg every other day for Emily Mcclain myasthenia gravis.  This is being titrated down.  Emily Mcclain has had Emily Mcclain seasonal flu shot and has had one of Emily Mcclain pneumonia vaccines.  Emily Mcclain has never smoked or vaped.  There is no family history of lung disease, and Emily Mcclain denies a history of childhood asthma.     Past Medical History:  Diagnosis Date  . Allergic rhinitis   . Anemia   . Asthma   . Generalized headaches   . H/O myasthenia gravis    not confirmed per Emily Mcclain  . Hypertension   . Obesity (BMI 30.0-34.9)      Family History  Problem Relation Age of Onset  . Sudden death Father   . Prostate cancer Brother   . Prostate  cancer Brother   . Prostate cancer Brother      Past Surgical History:  Procedure Laterality Date  . ABDOMINAL HYSTERECTOMY    . BUNIONECTOMY Left     Social History   Socioeconomic History  . Marital status: Widowed    Spouse name: Not on file  . Number of children: 4  . Years of education: 2114  . Highest education level: Some college, no degree  Occupational History  . Not on file  Social Needs  . Financial resource strain: Not on file  . Food insecurity    Worry: Not on file    Inability: Not on file  . Transportation needs    Medical: Not on file    Non-medical: Not on file  Tobacco Use  . Smoking status: Never Smoker  . Smokeless tobacco: Never Used  Substance and Sexual Activity  . Alcohol use: No  . Drug use: No  . Sexual activity: Not on file  Lifestyle  . Physical activity    Days per week: Not on file    Minutes per session: Not on file  . Stress: Not on file  Relationships  . Social Musicianconnections    Talks on phone: Not on file    Gets together: Not on file    Attends religious service: Not on file    Active member of club or  organization: Not on file    Attends meetings of clubs or organizations: Not on file    Relationship status: Not on file  . Intimate partner violence    Fear of current or ex partner: Not on file    Emotionally abused: Not on file    Physically abused: Not on file    Forced sexual activity: Not on file  Other Topics Concern  . Not on file  Social History Narrative   Lives alone, spends most of Emily Mcclain time with Emily Mcclain daughters   Right handed   Caffeine: no     Allergies  Allergen Reactions  . Asa [Aspirin] Shortness Of Breath     Immunization History  Administered Date(s) Administered  . Influenza Whole 03/28/2011  . Influenza,inj,Quad PF,6+ Mos 02/26/2019    Outpatient Medications Prior to Visit  Medication Sig Dispense Refill  . albuterol (PROVENTIL HFA;VENTOLIN HFA) 108 (90 Base) MCG/ACT inhaler as needed.    .  Cholecalciferol (VITAMIN D3 PO) Take 5,000 Int'l Units by mouth daily.     Marland Kitchen diltiazem (CARTIA XT) 180 MG 24 hr capsule Take 180 mg by mouth daily.    Marland Kitchen esomeprazole (NEXIUM) 20 MG capsule daily.    . fluticasone (FLOVENT HFA) 110 MCG/ACT inhaler Inhale 1 puff into the lungs 2 (two) times daily.    . predniSONE (DELTASONE) 10 MG tablet 15 mg. qod    . SPIRIVA RESPIMAT 1.25 MCG/ACT AERS INHALE 1 PUFF BY MOUTH EVERY DAY    . triamterene-hydrochlorothiazide (DYAZIDE) 37.5-25 MG capsule Take 1 capsule by mouth daily.    . Mometasone Furoate (ASMANEX HFA) 200 MCG/ACT AERO Inhale into the lungs daily.    Marland Kitchen levalbuterol (XOPENEX) 0.63 MG/3ML nebulizer solution as needed.     No facility-administered medications prior to visit.     Review of Systems  Constitutional: Negative for chills, fever and weight loss.  HENT: Negative for congestion and sinus pain.   Eyes: Negative.   Respiratory: Negative for cough, shortness of breath and wheezing.   Cardiovascular: Negative for chest pain, palpitations and leg swelling.  Gastrointestinal: Negative for blood in stool, heartburn, nausea and vomiting.  Genitourinary: Negative for dysuria and hematuria.  Musculoskeletal: Negative for joint pain and myalgias.  Skin: Negative for rash.  Neurological: Negative for dizziness and focal weakness.  Endo/Heme/Allergies: Negative for environmental allergies.  Psychiatric/Behavioral: Negative.      Objective:   Vitals:   04/24/19 1039  BP: 120/60  Pulse: 93  Temp: 97.9 F (36.6 C)  TempSrc: Oral  SpO2: 100%  Weight: 154 lb 6.4 oz (70 kg)  Height: 5' (1.524 m)   100% on   RA BMI Readings from Last 3 Encounters:  04/24/19 30.15 kg/m  02/17/19 29.49 kg/m  11/13/18 27.73 kg/m   Wt Readings from Last 3 Encounters:  04/24/19 154 lb 6.4 oz (70 kg)  02/17/19 151 lb (68.5 kg)  11/13/18 142 lb (64.4 kg)    Physical Exam Vitals signs reviewed.  Constitutional:      General: Emily Mcclain is not in acute  distress.    Appearance: Normal appearance. Emily Mcclain is not ill-appearing.  HENT:     Head: Normocephalic and atraumatic.     Nose:     Comments: Deferred due to masking requirement.    Mouth/Throat:     Comments: Deferred due to masking requirement. Eyes:     General: No scleral icterus. Neck:     Musculoskeletal: Neck supple.  Cardiovascular:     Rate and Rhythm:  Normal rate and regular rhythm.     Heart sounds: No murmur.  Pulmonary:     Comments: Breathing comfortably on room air, no conversational dyspnea or tachypnea.  Clear to auscultation bilaterally.  No witnessed coughing. Abdominal:     General: Abdomen is flat. There is no distension.     Palpations: Abdomen is soft.     Tenderness: There is no abdominal tenderness.  Musculoskeletal:        General: No swelling or deformity.  Lymphadenopathy:     Cervical: No cervical adenopathy.  Skin:    General: Skin is warm and dry.     Findings: No rash.  Neurological:     Mental Status: Emily Mcclain is alert.     Motor: No weakness.     Coordination: Coordination normal.  Psychiatric:        Mood and Affect: Mood normal.        Behavior: Behavior normal.      CBC    Component Value Date/Time   WBC 5.3 06/27/2018 1506   RBC 3.77 06/27/2018 1506   HGB 11.8 06/27/2018 1506   HCT 36.3 06/27/2018 1506   PLT 284 06/27/2018 1506   MCV 96 06/27/2018 1506   MCH 31.3 06/27/2018 1506   MCHC 32.5 06/27/2018 1506   RDW 13.1 06/27/2018 1506      Chest Imaging- films reviewed: CT chest 01/20/2019 performed at [facility] only available.  Subpleural reticulations with architectural distortion anterior RUL.  5 mm LUL nodule, diffuse airway thickening.  CT chest 07/07/2015 performed at [facility], report only available- 5 mm LUL nodule, no PE  CXR, 2 view-focal right upper lobe opacity with likely cystic change near the inferior clavicle and rib 2 during no costal joint.  Otherwise normal CXR.   Pulmonary Functions Testing Results: No flowsheet data found.   Echocardiogram 11/04/2018: LVEF 82%, normal diastolic function.  Grade 1 MR, mild to moderate TR.  Elevated PASP 34.      Assessment & Plan:     ICD-10-CM   1. Moderate persistent asthma without complication  X93.71 Pulmonary function test  2. Immunosuppression due to chronic steroid use  Z79.52   3. Hx of myasthenia gravis  Z86.69 Pulmonary function test   Moderate persistent asthma, no acute exacerbation. -Keep using Spiriva once daily and Flovent two times daily. Rinse your mouth after every use.  Since you are doing well, there is no need to change Emily Mcclain inhalers at this time. -You can use albuterol as needed, up to every 4 hours for symptoms.  If you want to continue using albuterol before exercise, that is fine -Discussed indications that we might need to change therapy-Emily Mcclain has nighttime symptoms, activity limitation due to breathing, more frequent albuterol need.  They are aware that Emily Mcclain could have worsening asthma symptoms as Emily Mcclain is titrated off prednisone, and if this happens Emily Mcclain may need Emily Mcclain inhaler regimen to be escalated. -PFTs -Up-to-date on flu vaccine  Chronic prednisone use for myasthenia gravis -Monitor for worsening of asthma as steroids are weaned -PFTs to rule out restrictive lung disease due to neuromuscular weakness.   RTC in 3 months.   Current Outpatient Medications:  .  albuterol (PROVENTIL HFA;VENTOLIN HFA) 108 (90 Base) MCG/ACT inhaler, as needed., Disp: , Rfl:  .  Cholecalciferol (VITAMIN D3 PO), Take 5,000 Int'l Units by mouth daily. , Disp: , Rfl:  .  diltiazem (CARTIA XT) 180 MG 24 hr capsule, Take 180 mg by mouth  daily., Disp: , Rfl:  .  esomeprazole (NEXIUM) 20 MG capsule, daily., Disp: , Rfl:  .  fluticasone (FLOVENT HFA) 110 MCG/ACT inhaler, Inhale 1 puff into the lungs 2 (two) times daily., Disp: , Rfl:  .  predniSONE (DELTASONE) 10 MG tablet, 15 mg. qod, Disp: , Rfl:  .  SPIRIVA  RESPIMAT 1.25 MCG/ACT AERS, INHALE 1 PUFF BY MOUTH EVERY DAY, Disp: , Rfl:  .  triamterene-hydrochlorothiazide (DYAZIDE) 37.5-25 MG capsule, Take 1 capsule by mouth daily., Disp: , Rfl:  .  levalbuterol (XOPENEX) 0.63 MG/3ML nebulizer solution, as needed., Disp: , Rfl:    Steffanie Dunn, DO [facility] 04/24/2019 12:08 PM

## 2019-04-24 NOTE — Patient Instructions (Addendum)
Thank you for visiting Dr. Carlis Abbott at [facility]. We recommend the following: Orders Placed This Encounter  Procedures  . Pulmonary function test   Orders Placed This Encounter  Procedures  . Pulmonary function test    Standing Status:   Future    Standing Expiration Date:   04/23/2020    Order Specific Question:   Where should this test be performed?    Answer:   [facility]    Order Specific Question:   Full PFT: includes the following: basic spirometry, spirometry pre & post bronchodilator, diffusion capacity (DLCO), lung volumes    Answer:   Full PFT    Keep using Spiriva once daily.  Keep using flovent two times daily. Rinse your mouth after every use. You can use albuterol as needed, up to every 4 hours for symptoms.    Return in about 3 months (around 07/25/2019).    Please do your part to reduce the spread of COVID-19.

## 2019-05-21 ENCOUNTER — Other Ambulatory Visit: Payer: Self-pay

## 2019-05-21 ENCOUNTER — Ambulatory Visit (INDEPENDENT_AMBULATORY_CARE_PROVIDER_SITE_OTHER): Payer: Medicare Other

## 2019-05-21 DIAGNOSIS — I10 Essential (primary) hypertension: Secondary | ICD-10-CM

## 2019-05-21 DIAGNOSIS — R0609 Other forms of dyspnea: Secondary | ICD-10-CM

## 2019-05-22 NOTE — Progress Notes (Signed)
Pt aware.

## 2019-07-23 ENCOUNTER — Telehealth: Payer: Self-pay | Admitting: Critical Care Medicine

## 2019-07-23 NOTE — Telephone Encounter (Signed)
Spoke with the pt's daughter and advised that the can get the covid test done else where but it still has to be done 3 days prior and needs to be a PCR test- she will have to prove negative result before the PFT is done. She understands that the test will be cancelled if result not proven to be negative.

## 2019-08-07 ENCOUNTER — Ambulatory Visit: Payer: Medicare Other | Admitting: Critical Care Medicine

## 2019-08-07 ENCOUNTER — Ambulatory Visit: Payer: Medicare Other | Admitting: Pulmonary Disease

## 2019-08-29 ENCOUNTER — Other Ambulatory Visit: Payer: Self-pay

## 2019-08-29 ENCOUNTER — Encounter: Payer: Self-pay | Admitting: Critical Care Medicine

## 2019-08-29 ENCOUNTER — Ambulatory Visit (INDEPENDENT_AMBULATORY_CARE_PROVIDER_SITE_OTHER): Payer: Medicare PPO | Admitting: Critical Care Medicine

## 2019-08-29 VITALS — BP 132/70 | HR 75 | Temp 97.6°F | Ht 60.0 in | Wt 150.8 lb

## 2019-08-29 DIAGNOSIS — J4540 Moderate persistent asthma, uncomplicated: Secondary | ICD-10-CM

## 2019-08-29 DIAGNOSIS — Z8669 Personal history of other diseases of the nervous system and sense organs: Secondary | ICD-10-CM

## 2019-08-29 LAB — CBC WITH DIFFERENTIAL/PLATELET
Basophils Absolute: 0 10*3/uL (ref 0.0–0.1)
Basophils Relative: 0.8 % (ref 0.0–3.0)
Eosinophils Absolute: 0.1 10*3/uL (ref 0.0–0.7)
Eosinophils Relative: 1.4 % (ref 0.0–5.0)
HCT: 37 % (ref 36.0–46.0)
Hemoglobin: 12.4 g/dL (ref 12.0–15.0)
Lymphocytes Relative: 38 % (ref 12.0–46.0)
Lymphs Abs: 2.4 10*3/uL (ref 0.7–4.0)
MCHC: 33.4 g/dL (ref 30.0–36.0)
MCV: 96.7 fl (ref 78.0–100.0)
Monocytes Absolute: 0.7 10*3/uL (ref 0.1–1.0)
Monocytes Relative: 10.4 % (ref 3.0–12.0)
Neutro Abs: 3.1 10*3/uL (ref 1.4–7.7)
Neutrophils Relative %: 49.4 % (ref 43.0–77.0)
Platelets: 251 10*3/uL (ref 150.0–400.0)
RBC: 3.82 Mil/uL — ABNORMAL LOW (ref 3.87–5.11)
RDW: 13.3 % (ref 11.5–15.5)
WBC: 6.4 10*3/uL (ref 4.0–10.5)

## 2019-08-29 MED ORDER — FLUTICASONE-SALMETEROL 250-50 MCG/DOSE IN AEPB: 1.0000 | INHALATION_SPRAY | Freq: Two times a day (BID) | RESPIRATORY_TRACT | 11 refills | Status: DC

## 2019-08-29 NOTE — Progress Notes (Signed)
Spirometry pre and post done today. 

## 2019-08-29 NOTE — Patient Instructions (Addendum)
Thank you for visiting Dr. Chestine Spore at [facility]. We recommend the following: Orders Placed This Encounter  Procedures  . IgE  . CBC with Differential   Orders Placed This Encounter  Procedures  . IgE    Standing Status:   Future    Standing Expiration Date:   08/28/2020  . CBC with Differential    Stop Flovent until you get your new inhaler (Advair or substitute if Advair is not preferred with your insurance)  Keep taking Spiriva.  Meds ordered this encounter  Medications  . Fluticasone-Salmeterol (ADVAIR DISKUS) 250-50 MCG/DOSE AEPB    Sig: Inhale 1 puff into the lungs 2 (two) times daily.    Dispense:  60 each    Refill:  11    Return in about 6 weeks (around 10/10/2019).    Please do your part to reduce the spread of COVID-19.

## 2019-08-29 NOTE — Progress Notes (Signed)
Synopsis: Referred in November 2020 for asthma by Rogers Blocker, MD.  Subjective:   PATIENT ID: Emily Mcclain GENDER: female DOB: 1940-12-07, MRN: [id number]  Chief Complaint  Patient presents with  . Follow-up    Patient is here for ROV after PFT. Patient feels good overall.    Emily Mcclain is a 79 year old woman with a history of asthma who presents for follow-up.  She completed her PFTs today.  She has remained on Flovent twice daily and Spiriva.  She is using albuterol most days, up to 3 times per day.  She has shortness of breath, but no wheezing, coughing, nighttime symptoms.  Smells and odors can trigger worse symptoms.  She is not on allergy medications, although she has an allergy to ragweed.  She is on chronic prednisone for myasthenia gravis, which is being titrated down (currently 10mg  every other day).    OV 04/24/2019: Ms. 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  She is accompanied today by her daughter who she lives with.  She was diagnosed with asthma in 1975, but did well until around 2014 when her asthma worsened.  She has allergies to ragweed, but does not have associated breathing issues and seldom has symptoms.  Starting the spring her symptoms of shortness of breath and wheezing started, which have improved with Spiriva and Flovent inhalers.  She is using these as prescribed and rinses her mouth after her Flovent.  Her symptoms are exacerbated by fumes, strong smells, and chemical smells.  Currently she is not limited in her activity-she can walk several blocks before stopping.  She denies cough, sputum production, chest pain, or heartburn.  She does not have nocturnal symptoms.  She uses her albuterol 3-4 times a day some days, but does this because she feels like she is supposed to, not because symptoms trigger her to.  She is on chronic prednisone-15 mg every  other day for her myasthenia gravis.  This is being titrated down.  She has had her seasonal flu shot and has had one of her pneumonia vaccines.  She has never smoked or vaped.  There is no family history of lung disease, and she denies a history of childhood asthma.    Past Medical History:  Diagnosis Date  . Allergic rhinitis   . Anemia   . Asthma   . Generalized headaches   . H/O myasthenia gravis    not confirmed per daughter  . Hypertension   . Obesity (BMI 30.0-34.9)      Family History  Problem Relation Age of Onset  . Sudden death Father   . Prostate cancer Brother   . Prostate cancer Brother   . Prostate cancer Brother      Past Surgical History:  Procedure Laterality Date  . ABDOMINAL HYSTERECTOMY    . BUNIONECTOMY Left     Social History   Socioeconomic History  . Marital status: Widowed    Spouse name: Not on file  . Number of children: 4  . Years of education: 32  . Highest education level: Some college, no degree  Occupational History  . Not on file  Tobacco Use  . Smoking status: Never Smoker  . Smokeless tobacco: Never Used  Substance and Sexual Activity  . Alcohol use: No  . Drug use: No  . Sexual activity: Not on file  Other Topics Concern  . Not  on file  Social History Narrative   Lives alone, spends most of her time with her daughters   Right handed   Caffeine: no   Social Determinants of Health   Financial Resource Strain:   . Difficulty of Paying Living Expenses:   Food Insecurity:   . Worried About Programme researcher, broadcasting/film/video in the Last Year:   . Barista in the Last Year:   Transportation Needs:   . Freight forwarder (Medical):   Marland Kitchen Lack of Transportation (Non-Medical):   Physical Activity:   . Days of Exercise per Week:   . Minutes of Exercise per Session:   Stress:   . Feeling of Stress :   Social Connections:   . Frequency of Communication with Friends and Family:   . Frequency of Social Gatherings with Friends and  Family:   . Attends Religious Services:   . Active Member of Clubs or Organizations:   . Attends Banker Meetings:   Marland Kitchen Marital Status:   Intimate Partner Violence:   . Fear of Current or Ex-Partner:   . Emotionally Abused:   Marland Kitchen Physically Abused:   . Sexually Abused:      Allergies  Allergen Reactions  . Asa [Aspirin] Shortness Of Breath     Immunization History  Administered Date(s) Administered  . Influenza Split 02/24/2013  . Influenza Whole 03/28/2011  . Influenza, High Dose Seasonal PF 03/11/2018  . Influenza, Seasonal, Injecte, Preservative Fre 02/24/2013  . Influenza,inj,Quad PF,6+ Mos 02/26/2019    Outpatient Medications Prior to Visit  Medication Sig Dispense Refill  . albuterol (PROVENTIL HFA;VENTOLIN HFA) 108 (90 Base) MCG/ACT inhaler as needed.    . Cholecalciferol (VITAMIN D3 PO) Take 5,000 Int'l Units by mouth daily.     Marland Kitchen diltiazem (CARTIA XT) 180 MG 24 hr capsule Take 180 mg by mouth daily.    Marland Kitchen esomeprazole (NEXIUM) 20 MG capsule daily.    . fluticasone (FLOVENT HFA) 110 MCG/ACT inhaler Inhale 1 puff into the lungs 2 (two) times daily.    . predniSONE (DELTASONE) 10 MG tablet Take 10 mg by mouth daily. qod    . SPIRIVA RESPIMAT 1.25 MCG/ACT AERS INHALE 1 PUFF BY MOUTH EVERY DAY    . triamterene-hydrochlorothiazide (DYAZIDE) 37.5-25 MG capsule Take 1 capsule by mouth daily.    Marland Kitchen levalbuterol (XOPENEX) 0.63 MG/3ML nebulizer solution as needed.     No facility-administered medications prior to visit.    Review of Systems  Constitutional: Negative for chills, fever and weight loss.  HENT: Negative for congestion and sinus pain.   Eyes: Negative.   Respiratory: Negative for cough, shortness of breath and wheezing.   Cardiovascular: Negative for chest pain, palpitations and leg swelling.  Gastrointestinal: Negative for blood in stool, heartburn, nausea and vomiting.  Genitourinary: Negative for dysuria and hematuria.  Musculoskeletal: Negative  for joint pain and myalgias.  Skin: Negative for rash.  Neurological: Negative for dizziness and focal weakness.  Endo/Heme/Allergies: Negative for environmental allergies.  Psychiatric/Behavioral: Negative.      Objective:   Vitals:   08/29/19 1040  BP: 132/70  Pulse: 75  Temp: 97.6 F (36.4 C)  TempSrc: Temporal  SpO2: 98%  Weight: 150 lb 12.8 oz (68.4 kg)  Height: 5' (1.524 m)   98% on   RA BMI Readings from Last 3 Encounters:  08/29/19 29.45 kg/m  04/24/19 30.15 kg/m  02/17/19 29.49 kg/m   Wt Readings from Last 3 Encounters:  08/29/19 150 lb 12.8  oz (68.4 kg)  04/24/19 154 lb 6.4 oz (70 kg)  02/17/19 151 lb (68.5 kg)    Physical Exam Vitals reviewed.  Constitutional:      Appearance: She is not ill-appearing.  HENT:     Head: Normocephalic and atraumatic.  Eyes:     General: No scleral icterus. Cardiovascular:     Rate and Rhythm: Normal rate and regular rhythm.     Heart sounds: No murmur.  Pulmonary:     Comments: Breathing comfortably on room air, no conversational dyspnea.  Prolonged exhalation, no accessory lung sounds. Abdominal:     General: There is no distension.     Palpations: Abdomen is soft.  Musculoskeletal:        General: No swelling or deformity.     Cervical back: Neck supple.  Lymphadenopathy:     Cervical: No cervical adenopathy.  Skin:    General: Skin is warm and dry.  Neurological:     General: No focal deficit present.     Mental Status: She is alert.     Motor: No weakness.     Coordination: Coordination normal.  Psychiatric:        Mood and Affect: Mood normal.        Behavior: Behavior normal.      CBC    Component Value Date/Time   WBC 5.3 06/27/2018 1506   RBC 3.77 06/27/2018 1506   HGB 11.8 06/27/2018 1506   HCT 36.3 06/27/2018 1506   PLT 284 06/27/2018 1506   MCV 96 06/27/2018 1506   MCH 31.3 06/27/2018 1506   MCHC 32.5 06/27/2018 1506   RDW 13.1 06/27/2018 1506      Chest Imaging- films  reviewed: CT chest 01/20/2019 performed at [facility] only available.  Subpleural reticulations with architectural distortion anterior RUL.  5 mm LUL nodule, diffuse airway thickening.  CT chest 07/07/2015 performed at [facility], report only available- 5 mm LUL nodule, no PE  CXR, 2 view-focal right upper lobe opacity with likely cystic change near the inferior clavicle and rib 2 during no costal joint.  Otherwise normal CXR.  Pulmonary Functions Testing Results: PFT Results Latest Ref Rng & Units 08/29/2019  FVC-Pre L 1.07  FVC-Predicted Pre % 64  FVC-Post L 1.56  FVC-Predicted Post % 93  Pre FEV1/FVC % % 53  Post FEV1/FCV % % 50  FEV1-Pre L 0.56  FEV1-Predicted Pre % 44  FEV1-Post L 0.77   2021- severe obstruction with significant bronchodilator reversibility.  Echocardiogram 11/04/2018: LVEF 56%, normal diastolic function.  Grade 1 MR, mild to moderate TR.  Elevated PASP 34.  Lexiscan 05/21/2019: EF 74% with normal wall motion, normal myocardial perfusion, low risk study     Assessment & Plan:     ICD-10-CM   1. Moderate persistent asthma without complication  J45.40      Moderate persistent asthma, uncontrolled based on PFTs. -Continue Spiriva once daily -Stop Flovent and start Advair 2 times daily.  Rinse her mouth after every use -Albuterol every 4 hours as needed  -Continue physical activity as tolerated.  This likely would improve with better asthma control. -Up-to-date on flu vaccine.  Recommend Covid vaccine.  Chronic prednisone use for myasthenia gravis; unable to complete lung volumes to evaluate for neuromuscular weakness. -Continue to monitor her symptoms while prednisone is being tapered off   RTC in 6 weeks.   Current Outpatient Medications:  .  albuterol (PROVENTIL HFA;VENTOLIN HFA) 108 (90 Base) MCG/ACT inhaler,  as needed., Disp: , Rfl:  .  Cholecalciferol (VITAMIN D3 PO), Take 5,000 Int'l Units by mouth daily. ,  Disp: , Rfl:  .  diltiazem (CARTIA XT) 180 MG 24 hr capsule, Take 180 mg by mouth daily., Disp: , Rfl:  .  esomeprazole (NEXIUM) 20 MG capsule, daily., Disp: , Rfl:  .  fluticasone (FLOVENT HFA) 110 MCG/ACT inhaler, Inhale 1 puff into the lungs 2 (two) times daily., Disp: , Rfl:  .  predniSONE (DELTASONE) 10 MG tablet, Take 10 mg by mouth daily. qod, Disp: , Rfl:  .  SPIRIVA RESPIMAT 1.25 MCG/ACT AERS, INHALE 1 PUFF BY MOUTH EVERY DAY, Disp: , Rfl:  .  triamterene-hydrochlorothiazide (DYAZIDE) 37.5-25 MG capsule, Take 1 capsule by mouth daily., Disp: , Rfl:  .  levalbuterol (XOPENEX) 0.63 MG/3ML nebulizer solution, as needed., Disp: , Rfl:    Steffanie Dunn, DO [facility] 08/29/2019 11:11 AM

## 2019-09-01 LAB — IGE: IgE (Immunoglobulin E), Serum: 56 kU/L (ref <=114)

## 2019-09-02 LAB — PULMONARY FUNCTION TEST
FEF 25-75 Post: 0.56 L/sec
FEF 25-75 Pre: 0.22 L/sec
FEF2575-%Change-Post: 151 %
FEF2575-%Pred-Post: 50 %
FEF2575-%Pred-Pre: 20 %
FEV1-%Change-Post: 37 %
FEV1-%Pred-Post: 60 %
FEV1-%Pred-Pre: 44 %
FEV1-Post: 0.77 L
FEV1-Pre: 0.56 L
FEV1FVC-%Change-Post: -5 %
FEV1FVC-%Pred-Pre: 69 %
FEV6-%Change-Post: 45 %
FEV6-%Pred-Post: 96 %
FEV6-%Pred-Pre: 65 %
FEV6-Post: 1.51 L
FEV6-Pre: 1.04 L
FEV6FVC-%Change-Post: 0 %
FEV6FVC-%Pred-Post: 102 %
FEV6FVC-%Pred-Pre: 102 %
FVC-%Change-Post: 45 %
FVC-%Pred-Post: 93 %
FVC-%Pred-Pre: 64 %
FVC-Post: 1.56 L
FVC-Pre: 1.07 L
Post FEV1/FVC ratio: 50 %
Post FEV6/FVC ratio: 97 %
Pre FEV1/FVC ratio: 53 %
Pre FEV6/FVC Ratio: 97 %

## 2019-10-01 ENCOUNTER — Other Ambulatory Visit: Payer: Self-pay

## 2019-10-01 ENCOUNTER — Ambulatory Visit: Payer: Medicare PPO | Admitting: Critical Care Medicine

## 2019-10-01 ENCOUNTER — Encounter: Payer: Self-pay | Admitting: Critical Care Medicine

## 2019-10-01 VITALS — BP 118/78 | HR 83 | Temp 97.9°F | Ht 60.0 in | Wt 149.2 lb

## 2019-10-01 DIAGNOSIS — Z7952 Long term (current) use of systemic steroids: Secondary | ICD-10-CM

## 2019-10-01 DIAGNOSIS — D84821 Immunodeficiency due to drugs: Secondary | ICD-10-CM

## 2019-10-01 DIAGNOSIS — J4540 Moderate persistent asthma, uncomplicated: Secondary | ICD-10-CM

## 2019-10-01 MED ORDER — SPIRIVA RESPIMAT 1.25 MCG/ACT IN AERS: 2.0000 | INHALATION_SPRAY | Freq: Every day | RESPIRATORY_TRACT | 11 refills | Status: AC

## 2019-10-01 NOTE — Progress Notes (Signed)
Synopsis: Referred in November 2020 for asthma by Gwenyth Bender, MD.  Subjective:   PATIENT ID: Emily Mcclain GENDER: female DOB: 1940-08-26, MRN: [id number]  Chief Complaint  Patient presents with  . Follow-up    Patient feels better since last visit. Patient is feeling good overall.    Emily Mcclain is a 79 year old woman who presents for follow-up of asthma.  She is accompanied today by her daughter.  At her last visit she was switched from Flovent to [place].  Her symptoms have improved and she is using albuterol less frequently.  She stopped taking Spiriva due to running out a few weeks ago, and is unsure if her symptoms were better when she was still on Spiriva.  She uses her albuterol infrequently, mostly before exercise.  Her symptoms are still triggered by strong smells.  Breathing is not significantly limiting her physical activity.  She walks for exercise and notices that she recovers faster when she does stop to rest.  She continues on 10 mg of prednisone every other day for myasthenia gravis.  She follows with Dr. Allena Katz in neurology at [facility].       OV 08/29/19: Emily Mcclain is a 79 year old woman with a history of asthma who presents for follow-up.  She completed her PFTs today.  She has remained on Flovent twice daily and Spiriva.  She is using albuterol most days, up to 3 times per day.  She has shortness of breath, but no wheezing, coughing, nighttime symptoms.  Smells and odors can trigger worse symptoms.  She is not on allergy medications, although she has an allergy to ragweed.  She is on chronic prednisone for myasthenia gravis, which is being titrated down (currently 10mg  every other day).   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  She is accompanied today by her daughter who she lives with.  She was diagnosed with asthma in 1975, but  did well until around 2014 when her asthma worsened.  She has allergies to ragweed, but does not have associated breathing issues and seldom has symptoms.  Starting the spring her symptoms of shortness of breath and wheezing started, which have improved with Spiriva and Flovent inhalers.  She is using these as prescribed and rinses her mouth after her Flovent.  Her symptoms are exacerbated by fumes, strong smells, and chemical smells.  Currently she is not limited in her activity-she can walk several blocks before stopping.  She denies cough, sputum production, chest pain, or heartburn.  She does not have nocturnal symptoms.  She uses her albuterol 3-4 times a day some days, but does this because she feels like she is supposed to, not because symptoms trigger her to.  She is on chronic prednisone-15 mg every other day for her myasthenia gravis.  This is being titrated down.  She has had her seasonal flu shot and has had one of her pneumonia vaccines.  She has never smoked or vaped.  There is no family history of lung disease, and she denies a history of childhood asthma.    Past Medical History:  Diagnosis Date  . Allergic rhinitis   . Anemia   . Asthma   . Generalized headaches   . H/O myasthenia gravis    not confirmed per daughter  . Hypertension   . Obesity (BMI 30.0-34.9)      Family History  Problem Relation Age of Onset  . Sudden death Father   . Prostate cancer Brother   . Prostate cancer Brother   . Prostate cancer Brother      Past Surgical History:  Procedure Laterality Date  . ABDOMINAL HYSTERECTOMY    . BUNIONECTOMY Left     Social History   Socioeconomic History  . Marital status: Widowed    Spouse name: Not on file  . Number of children: 4  . Years of education: 62  . Highest education level: Some college, no degree  Occupational History  . Not on file  Tobacco Use  . Smoking status: Never Smoker  . Smokeless tobacco: Never Used  Substance and Sexual Activity   . Alcohol use: No  . Drug use: No  . Sexual activity: Not on file  Other Topics Concern  . Not on file  Social History Narrative   Lives alone, spends most of her time with her daughters   Right handed   Caffeine: no   Social Determinants of Health   Financial Resource Strain:   . Difficulty of Paying Living Expenses:   Food Insecurity:   . Worried About Charity fundraiser in the Last Year:   . Arboriculturist in the Last Year:   Transportation Needs:   . Film/video editor (Medical):   Marland Kitchen Lack of Transportation (Non-Medical):   Physical Activity:   . Days of Exercise per Week:   . Minutes of Exercise per Session:   Stress:   . Feeling of Stress :   Social Connections:   . Frequency of Communication with Friends and Family:   . Frequency of Social Gatherings with Friends and Family:   . Attends Religious Services:   . Active Member of Clubs or Organizations:   . Attends Archivist Meetings:   Marland Kitchen Marital Status:   Intimate Partner Violence:   . Fear of Current or Ex-Partner:   . Emotionally Abused:   Marland Kitchen Physically Abused:   . Sexually Abused:      Allergies  Allergen Reactions  . Asa [Aspirin] Shortness Of Breath     Immunization History  Administered Date(s) Administered  . Influenza Split 02/24/2013  . Influenza Whole 03/28/2011  . Influenza, High Dose Seasonal PF 03/11/2018  . Influenza, Seasonal, Injecte, Preservative Fre 02/24/2013  . Influenza,inj,Quad PF,6+ Mos 02/26/2019  . [place] SARS-COVID-2 Vaccination 09/03/2019    Outpatient Medications Prior to Visit  Medication Sig Dispense Refill  . albuterol (PROVENTIL HFA;VENTOLIN HFA) 108 (90 Base) MCG/ACT inhaler as needed.    . Cholecalciferol (VITAMIN D3 PO) Take 5,000 Int'l Units by mouth daily.     Marland Kitchen diltiazem (CARTIA XT) 180 MG 24 hr capsule Take 180 mg by mouth daily.    Marland Kitchen esomeprazole (NEXIUM) 20 MG capsule daily.    . Fluticasone-Salmeterol (ADVAIR DISKUS) 250-50 MCG/DOSE AEPB  Inhale 1 puff into the lungs 2 (two) times daily. 60 each 11  . predniSONE (DELTASONE) 10 MG tablet Take 10 mg by mouth every other day. qod    . triamterene-hydrochlorothiazide (DYAZIDE) 37.5-25 MG capsule Take 1 capsule by mouth daily.    Marland Kitchen levalbuterol (XOPENEX) 0.63 MG/3ML nebulizer solution as needed.    . fluticasone (FLOVENT HFA) 110 MCG/ACT inhaler Inhale 1 puff into the lungs 2 (two) times daily.    Marland Kitchen SPIRIVA RESPIMAT 1.25 MCG/ACT AERS INHALE 1 PUFF BY MOUTH EVERY DAY     No facility-administered medications prior to visit.    Review  of Systems  Constitutional: Negative for chills, fever and weight loss.  HENT: Negative for congestion and sinus pain.   Eyes: Negative.   Respiratory: Negative for cough, shortness of breath and wheezing.   Cardiovascular: Negative for chest pain, palpitations and leg swelling.  Gastrointestinal: Negative for blood in stool, heartburn, nausea and vomiting.  Genitourinary: Negative for dysuria and hematuria.  Musculoskeletal: Negative for joint pain and myalgias.  Skin: Negative for rash.  Neurological: Negative for dizziness and focal weakness.  Endo/Heme/Allergies: Negative for environmental allergies.  Psychiatric/Behavioral: Negative.      Objective:   Vitals:   10/01/19 1016  BP: 118/78  Pulse: 83  Temp: 97.9 F (36.6 C)  TempSrc: Temporal  SpO2: 98%  Weight: 149 lb 3.2 oz (67.7 kg)  Height: 5' (1.524 m)   98% on   RA BMI Readings from Last 3 Encounters:  10/01/19 29.14 kg/m  08/29/19 29.45 kg/m  04/24/19 30.15 kg/m   Wt Readings from Last 3 Encounters:  10/01/19 149 lb 3.2 oz (67.7 kg)  08/29/19 150 lb 12.8 oz (68.4 kg)  04/24/19 154 lb 6.4 oz (70 kg)    Physical Exam Vitals reviewed.  HENT:     Head: Normocephalic and atraumatic.  Eyes:     General: No scleral icterus. Cardiovascular:     Rate and Rhythm: Normal rate and regular rhythm.     Heart sounds: No murmur.  Pulmonary:     Comments: Breathing  comfortably on room air, no conversational dyspnea.  Clear to auscultation bilaterally. Abdominal:     General: There is no distension.     Palpations: Abdomen is soft.     Tenderness: There is no abdominal tenderness.  Musculoskeletal:        General: No swelling or deformity.     Cervical back: Neck supple.  Lymphadenopathy:     Cervical: No cervical adenopathy.  Skin:    General: Skin is warm and dry.     Findings: No rash.  Neurological:     General: No focal deficit present.     Mental Status: She is alert.     Coordination: Coordination normal.  Psychiatric:        Mood and Affect: Mood normal.        Behavior: Behavior normal.      CBC    Component Value Date/Time   WBC 6.4 08/29/2019 1131   RBC 3.82 (L) 08/29/2019 1131   HGB 12.4 08/29/2019 1131   HGB 11.8 06/27/2018 1506   HCT 37.0 08/29/2019 1131   HCT 36.3 06/27/2018 1506   PLT 251.0 08/29/2019 1131   PLT 284 06/27/2018 1506   MCV 96.7 08/29/2019 1131   MCV 96 06/27/2018 1506   MCH 31.3 06/27/2018 1506   MCHC 33.4 08/29/2019 1131   RDW 13.3 08/29/2019 1131   RDW 13.1 06/27/2018 1506   LYMPHSABS 2.4 08/29/2019 1131   MONOABS 0.7 08/29/2019 1131   EOSABS 0.1 08/29/2019 1131   BASOSABS 0.0 08/29/2019 1131   eos 140 on 08/29/19 (on prednisone for MG) IgE 56 on 08/29/19 (on prednisone for MG)  Chest Imaging- films reviewed: CT chest 01/20/2019 performed at [facility] only available.  Subpleural reticulations with architectural distortion anterior RUL.  5 mm LUL nodule, diffuse airway thickening.  CT chest 07/07/2015 performed at [facility], report only available- 5 mm LUL nodule, no PE  CXR, 2 view-focal right upper lobe opacity with likely cystic change near the inferior clavicle and rib  2 during no costal joint.  Otherwise normal CXR.  Pulmonary Functions Testing Results: PFT Results Latest Ref Rng & Units 08/29/2019  FVC-Pre L 1.07  FVC-Predicted Pre % 64  FVC-Post  L 1.56  FVC-Predicted Post % 93  Pre FEV1/FVC % % 53  Post FEV1/FCV % % 50  FEV1-Pre L 0.56  FEV1-Predicted Pre % 44  FEV1-Post L 0.77   2021- severe obstruction with significant bronchodilator reversibility.  Echocardiogram 11/04/2018: LVEF 56%, normal diastolic function.  Grade 1 MR, mild to moderate TR.  Elevated PASP 34.  Lexiscan 05/21/2019: EF 74% with normal wall motion, normal myocardial perfusion, low risk study     Assessment & Plan:     ICD-10-CM   1. Moderate persistent asthma without complication  J45.40   2. Immunosuppression due to chronic steroid use  Z79.52      Moderate persistent asthma, uncontrolled based on PFTs. Non-allergic phenotype, although potentially confounded by chronic prednisone use. -Continue [place] twice daily.  Instructed to rinse her mouth after every use. -Okay to restart Spiriva once daily.  If she notices no change in her symptoms, it is okay to discontinue this medication -Continue albuterol every 4 hours as needed -Continue regular physical activity as tolerated. -Up-to-date on flu vaccine.  Second dose of her Covid vaccine this afternoon. -If her symptoms become uncontrolled off prednisone, recommend rechecking IgE and eosinophil levels.  Chronic prednisone use for myasthenia gravis; unable to complete lung volumes to evaluate for neuromuscular weakness. -Continue to monitor her symptoms while prednisone is being tapered off.  Continue follow-up with Dr. Allena Katz in neurology.   RTC in 6 months.   Current Outpatient Medications:  .  albuterol (PROVENTIL HFA;VENTOLIN HFA) 108 (90 Base) MCG/ACT inhaler, as needed., Disp: , Rfl:  .  Cholecalciferol (VITAMIN D3 PO), Take 5,000 Int'l Units by mouth daily. , Disp: , Rfl:  .  diltiazem (CARTIA XT) 180 MG 24 hr capsule, Take 180 mg by mouth daily., Disp: , Rfl:  .  esomeprazole (NEXIUM) 20 MG capsule, daily., Disp: , Rfl:  .  Fluticasone-Salmeterol (ADVAIR DISKUS) 250-50 MCG/DOSE AEPB, Inhale 1  puff into the lungs 2 (two) times daily., Disp: 60 each, Rfl: 11 .  predniSONE (DELTASONE) 10 MG tablet, Take 10 mg by mouth every other day. qod, Disp: , Rfl:  .  triamterene-hydrochlorothiazide (DYAZIDE) 37.5-25 MG capsule, Take 1 capsule by mouth daily., Disp: , Rfl:  .  levalbuterol (XOPENEX) 0.63 MG/3ML nebulizer solution, as needed., Disp: , Rfl:  .  Tiotropium Bromide Monohydrate (SPIRIVA RESPIMAT) 1.25 MCG/ACT AERS, Inhale 2 puffs into the lungs daily., Disp: 4 g, Rfl: 11   Steffanie Dunn, DO [facility] 10/01/2019 1:47 PM

## 2019-10-01 NOTE — Patient Instructions (Addendum)
Thank you for visiting Dr. Chestine Spore at [facility]. We recommend the following:   Keep taking [place] (gray triangle/ disk shape) twice daily and rinse your mouth after every use, You need to stay on this medicine no matter how good or bad you feel.  You can restart the Spiriva (gray, tall inhaler) once daily. If you cannot tell a difference in your breathing when using this, you are okay to stop taking it.  Meds ordered this encounter  Medications  . Tiotropium Bromide Monohydrate (SPIRIVA RESPIMAT) 1.25 MCG/ACT AERS    Sig: Inhale 2 puffs into the lungs daily.    Dispense:  4 g    Refill:  11    Return in about 6 months (around 04/01/2020).     Please do your part to reduce the spread of COVID-19.

## 2019-10-08 ENCOUNTER — Other Ambulatory Visit: Payer: Self-pay

## 2020-01-15 IMAGING — MR MR HEAD WO/W CM
10 series · 48 of 48 positions shown · IV contrast (multihance)
Comparison: [HOSPITAL] [HOSPITAL] Head CT without
contrast 12/10/2011. report of [REDACTED] brain
MRI 02/21/2008 (no images available).

CLINICAL DATA: 77-year-old female with double vision for 4 months.
Partial 3rd nerve palsy.

EXAM:
MRI HEAD WITHOUT AND WITH CONTRAST
TECHNIQUE: Multiplanar, multiecho pulse sequences of the brain and surrounding
structures were obtained without and with intravenous contrast.
CONTRAST:  13mL MULTIHANCE GADOBENATE DIMEGLUMINE 529 MG/ML IV SOLN

[Series 2: T1 · sagittal · 5.0mm · 0.90mm/px · 1 of 27 slices shown (1 of 2)]
[im 1/27]
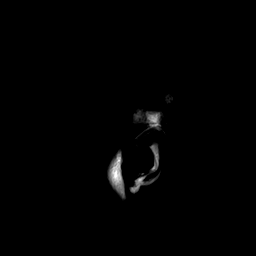

[Series 3: DWI · axial · 3.0mm · 1.88mm/px · z∈[-62,+92]mm · 9 of 96 slices shown (1 of 2)]
[im 1/96]
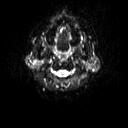
[im 12/96]
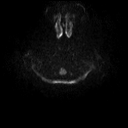
[im 24/96]
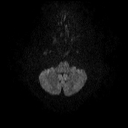
[im 36/96]
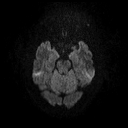
[im 48/96]
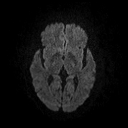
[im 60/96]
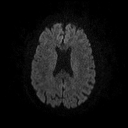
[im 72/96]
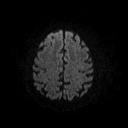
[im 84/96]
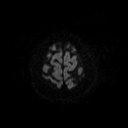
[im 96/96]
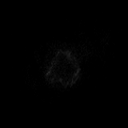

[Series 4: DWI · axial · 3.0mm · 1.88mm/px · z∈[-62,+92]mm · 4 of 48 slices shown (2 of 2)]
[im 1/48]
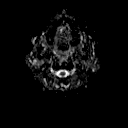
[im 16/48]
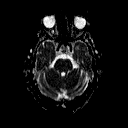
[im 32/48]
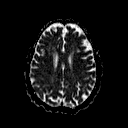
[im 48/48]
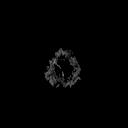

[Series 5: T2 · axial · 5.0mm · 0.69mm/px · z∈[-66,+95]mm · 3 of 28 slices shown (1 of 3)]
[im 1/28]
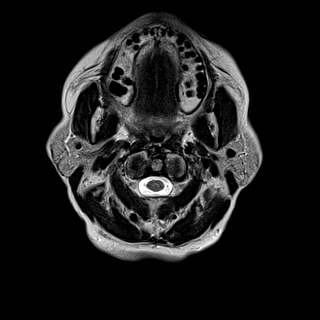
[im 14/28]
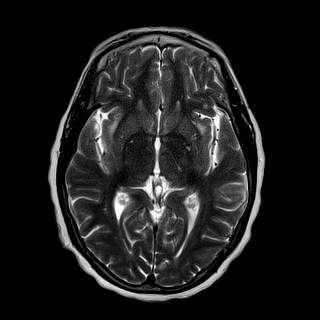
[im 28/28]
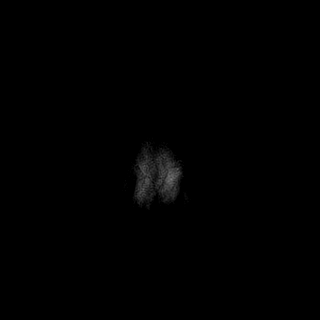

[Series 6: T2 · axial · 5.0mm · 0.43mm/px · z∈[-66,+95]mm · 3 of 28 slices shown (2 of 3)]
[im 1/28]
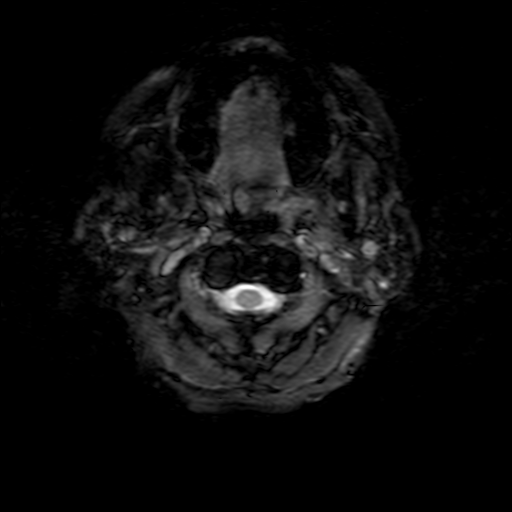
[im 14/28]
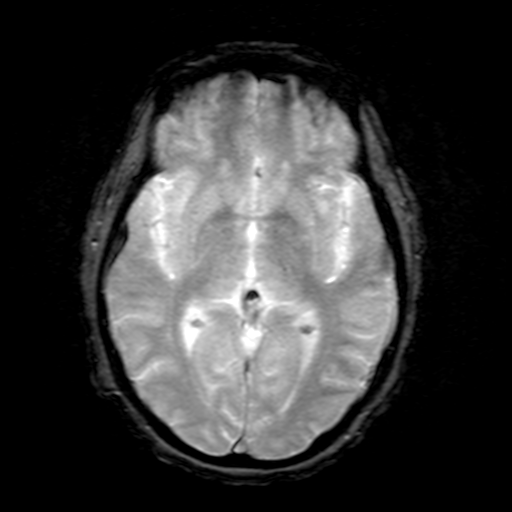
[im 28/28]
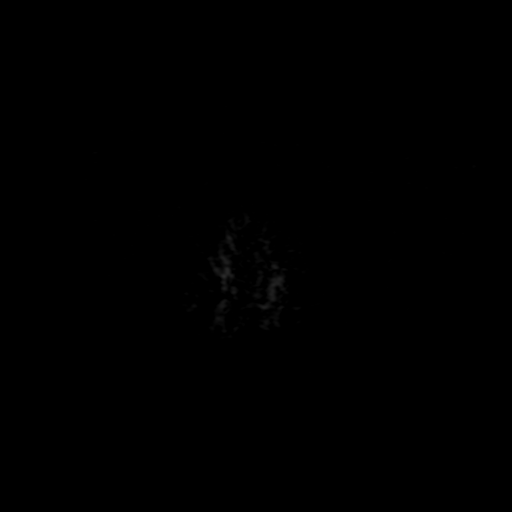

[Series 7: FLAIR · axial · 3.0mm · 0.43mm/px · z∈[-67,+96]mm · 4 of 42 slices shown]
[im 1/42]
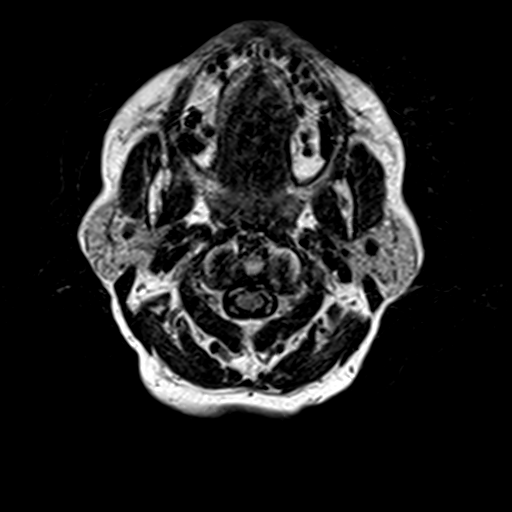
[im 14/42]
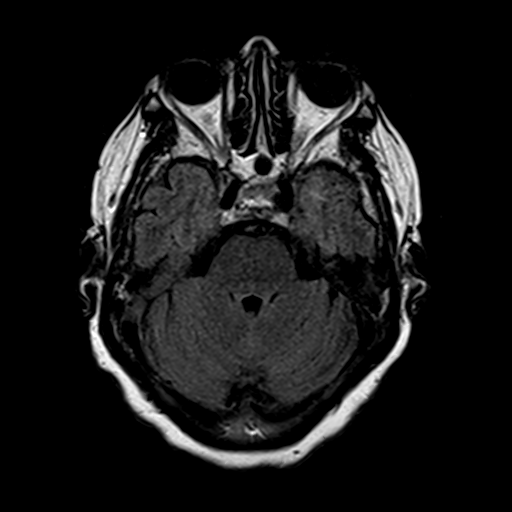
[im 28/42]
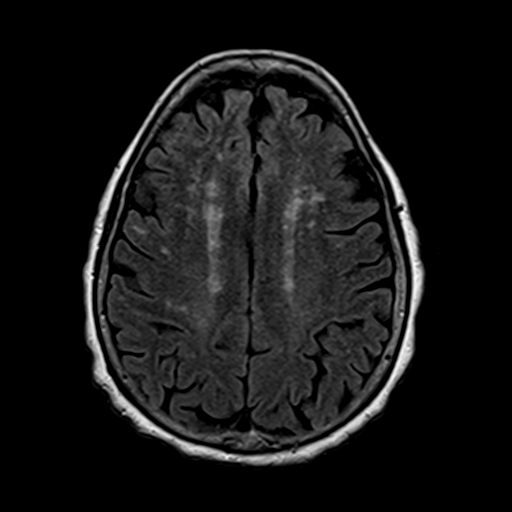
[im 42/42]
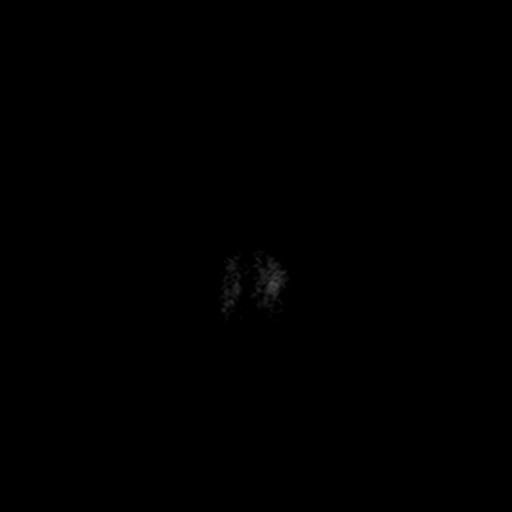

[Series 8: t1_3d_tra · axial · 2.0mm · 0.90mm/px · z∈[-78,+111]mm · 9 of 96 slices shown]
[im 1/96]
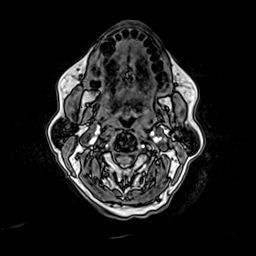
[im 12/96]
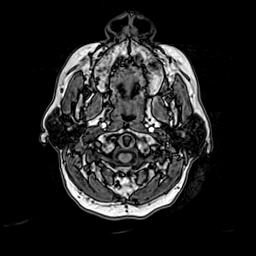
[im 24/96]
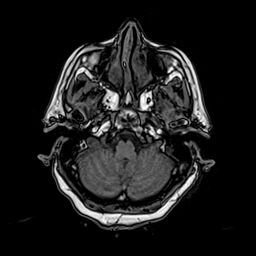
[im 36/96]
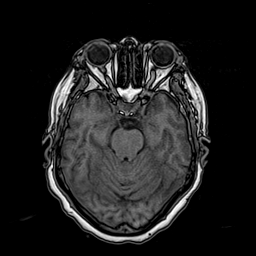
[im 48/96]
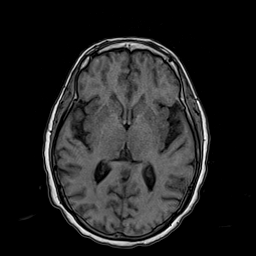
[im 60/96]
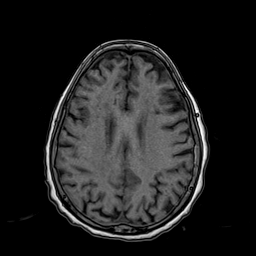
[im 72/96]
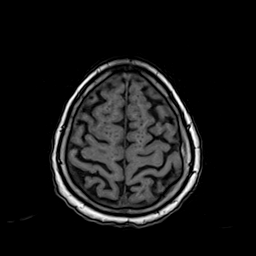
[im 84/96]
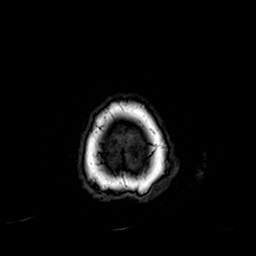
[im 96/96]
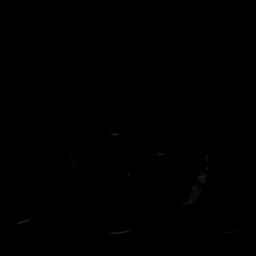

[Series 9: T2 · coronal · 5.0mm · 0.69mm/px · 3 of 30 slices shown (3 of 3)]
[im 1/30]
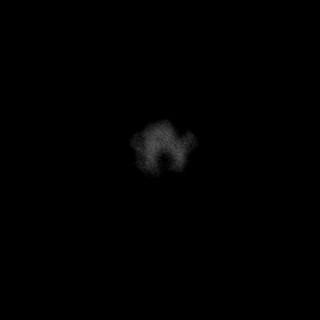
[im 15/30]
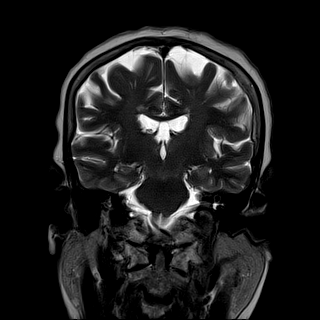
[im 30/30]
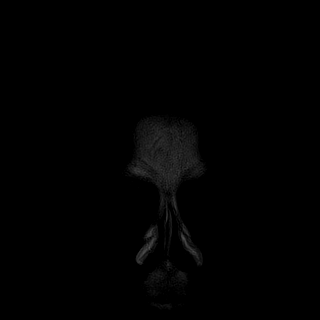

[Series 10: t1_3d_tra +c · axial · 2.0mm · 0.90mm/px · z∈[-78,+111]mm · 9 of 96 slices shown]
[im 1/96]
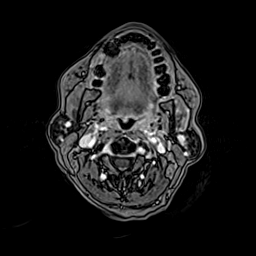
[im 12/96]
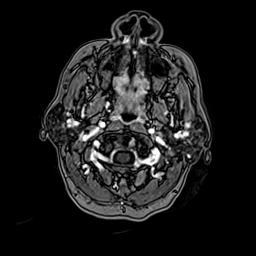
[im 24/96]
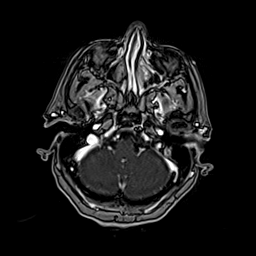
[im 36/96]
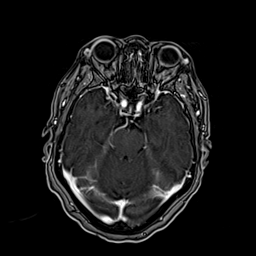
[im 48/96]
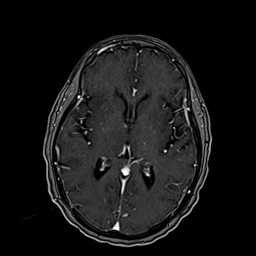
[im 60/96]
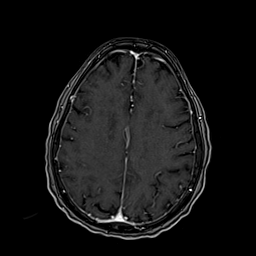
[im 72/96]
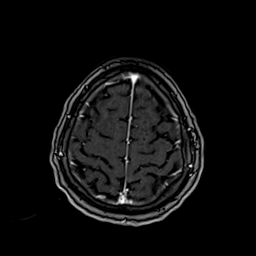
[im 84/96]
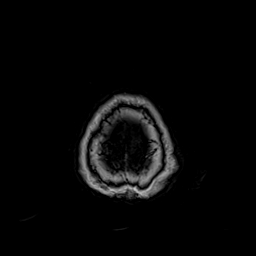
[im 96/96]
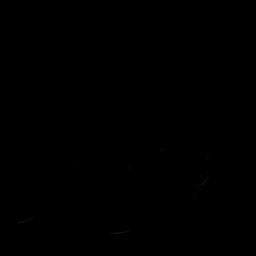

[Series 11: T1 · coronal · 5.0mm · 0.86mm/px · 3 of 30 slices shown (2 of 2)]
[im 1/30]
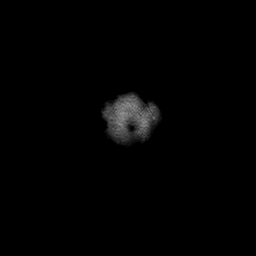
[im 15/30]
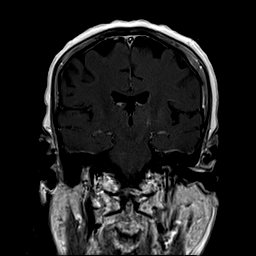
[im 30/30]
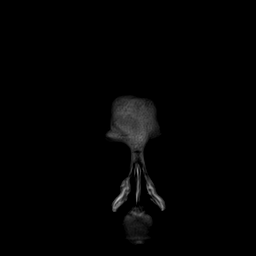

[48 of 48 positions shown; findings below may reference images not displayed]

FINDINGS: Brain: Cavum septum pellucidum (normal variant). Stable cerebral
volume since 0323. No restricted diffusion to suggest acute
infarction. No midline shift, mass effect, evidence of mass lesion,
ventriculomegaly, extra-axial collection or acute intracranial
hemorrhage. Cervicomedullary junction and pituitary are within
normal limits.

Patchy and scattered bilateral cerebral white matter T2 and FLAIR
hyperintensity. The extent is moderate for age, the pattern is
nonspecific. No cortical encephalomalacia or chronic cerebral blood
products identified.

The bilateral deep gray matter nuclei, brainstem, and cerebellum are
normal for age. No abnormal enhancement identified. No dural
thickening.

No 3rd nerve enhancement or abnormality is evident on these routine
brain images. Normal appearance of the cavernous sinus.

Vascular: Major intracranial vascular flow voids are preserved.
Fetal type bilateral PCA origins (normal variant). The major dural
venous sinuses are enhancing and appear patent.

Skull and upper cervical spine: Partially visible cervical spine
degeneration. Visualized bone marrow signal is within normal limits.

Sinuses/Orbits: Normal suprasellar cistern and optic chiasm.
Bilateral orbits soft tissues appear normal.

Somewhat hypoplastic but well pneumatized paranasal sinuses.

Other: Mastoid air cells are clear. Visible internal auditory
structures appear normal. Scalp and face soft tissues appear
negative.
IMPRESSION: No acute intracranial abnormality.
Moderate for age signal changes in the cerebral white matter are
nonspecific, but most commonly due to chronic small vessel disease.
Otherwise normal for age MRI appearance of the brain.

## 2020-01-28 ENCOUNTER — Other Ambulatory Visit: Payer: Medicare PPO

## 2020-02-06 ENCOUNTER — Ambulatory Visit: Payer: Medicare PPO

## 2020-02-06 ENCOUNTER — Other Ambulatory Visit: Payer: Self-pay

## 2020-02-06 DIAGNOSIS — R0609 Other forms of dyspnea: Secondary | ICD-10-CM

## 2020-02-06 DIAGNOSIS — I361 Nonrheumatic tricuspid (valve) insufficiency: Secondary | ICD-10-CM

## 2020-02-09 ENCOUNTER — Other Ambulatory Visit: Payer: Self-pay | Admitting: Internal Medicine

## 2020-02-09 DIAGNOSIS — Z1231 Encounter for screening mammogram for malignant neoplasm of breast: Secondary | ICD-10-CM

## 2020-02-16 ENCOUNTER — Other Ambulatory Visit: Payer: Self-pay

## 2020-02-16 ENCOUNTER — Ambulatory Visit: Payer: Medicare PPO | Admitting: Cardiology

## 2020-02-16 ENCOUNTER — Encounter: Payer: Self-pay | Admitting: Cardiology

## 2020-02-16 VITALS — BP 138/84 | HR 68 | Resp 16 | Ht 60.0 in | Wt 143.0 lb

## 2020-02-16 DIAGNOSIS — I340 Nonrheumatic mitral (valve) insufficiency: Secondary | ICD-10-CM

## 2020-02-16 DIAGNOSIS — G7000 Myasthenia gravis without (acute) exacerbation: Secondary | ICD-10-CM

## 2020-02-16 DIAGNOSIS — R0609 Other forms of dyspnea: Secondary | ICD-10-CM

## 2020-02-16 DIAGNOSIS — I10 Essential (primary) hypertension: Secondary | ICD-10-CM

## 2020-02-16 DIAGNOSIS — I361 Nonrheumatic tricuspid (valve) insufficiency: Secondary | ICD-10-CM

## 2020-02-16 NOTE — Progress Notes (Signed)
Primary Physician:  Gwenyth Bender, MD   Patient ID: Emily Mcclain, female    DOB: 1941-05-30, 79 y.o.   MRN: [id number]  Subjective:    Chief Complaint  Patient presents with  . Follow-up    1 year   . Tricuspid Regurgitation  . Shortness of Breath  . Results    Echo  . Hypertension   HPI: [name]  is a 79 y.o. 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 Docia Furl at [facility].    She presents here for follow-up of dyspnea on exertion.  Denies chest pain,  PND or orthopnea, no leg edema, no recent significant weight change. She is  Prednisone for MG.   Past Medical History:  Diagnosis Date  . Allergic rhinitis   . Anemia   . Asthma   . Generalized headaches   . H/O myasthenia gravis    not confirmed per daughter  . Hypertension   . Obesity (BMI 30.0-34.9)     Past Surgical History:  Procedure Laterality Date  . ABDOMINAL HYSTERECTOMY    . BUNIONECTOMY Left    Social History   Tobacco Use  . Smoking status: Never Smoker  . Smokeless tobacco: Never Used  Substance Use Topics  . Alcohol use: No   Marital Status: Widowed   Review of Systems  Cardiovascular: Positive for dyspnea on exertion. Negative for chest pain and leg swelling.  Gastrointestinal: Negative for melena.   Objective:  Blood pressure 138/84, pulse 68, resp. rate 16, height 5' (1.524 m), weight 143 lb (64.9 kg), SpO2 100 %. Body mass index is 27.93 kg/m.  Vitals with BMI 02/16/2020 10/01/2019 08/29/2019  Height 5\' 0"  5\' 0"  5\' 0"   Weight 143 lbs 149 lbs 3 oz 150 lbs 13 oz  BMI 27.93 29.14 29.45  Systolic 138 118  Diastolic 84 78 70  Pulse 68 83 75       Physical Exam Cardiovascular:     Rate and Rhythm: Normal rate and regular rhythm.     Pulses: Intact distal pulses.     Heart sounds: Normal heart sounds. No murmur heard.  No gallop.      Comments: No leg edema, no  JVD. Pulmonary:     Effort: Pulmonary effort is normal.     Breath sounds: Normal breath sounds.  Abdominal:     General: Bowel sounds are normal.     Palpations: Abdomen is soft.    Radiology: No results found.  Laboratory examination:    CMP Latest Ref Rng & Units 06/27/2018 08/07/2012  Glucose 65 - 99 mg/dL 84 -  BUN 8 - 27 mg/dL 15 -  Creatinine 967 - 1.00 mg/dL 08/26/2018) -  Sodium 08/09/2012 - 144 mmol/L 140 -  Potassium 3.5 - 5.2 mmol/L 4.1 3.8  Chloride 96 - 106 mmol/L 99 -  CO2 20 - 29 mmol/L 28 -  Calcium 8.7 - 10.3 mg/dL 8.93 -  Total Protein 6.0 - 8.5 g/dL 7.3 -  Total Bilirubin 0.0 - 1.2 mg/dL 0.2 -  Alkaline Phos 39 - 117 IU/L 82 -  AST 0 - 40 IU/L 20 -  ALT 0 - 32 IU/L 12 -   CBC Latest Ref Rng & Units 08/29/2019 06/27/2018 08/07/2012  WBC 4.0 - 10.5 K/uL 6.4 5.3 5.4  Hemoglobin 12.0 - 15.0 g/dL 10/29/2019 08/26/2018 11.4(A)  Hematocrit 36 - 46 % 37.0 36.3 34(A)  Platelets 150 - 400 K/uL 251.0 284 -   Lipid Panel     Component Value Date/Time   CHOL 190 08/07/2012 0000   TRIG 52 08/07/2012 0000   HDL 61 08/07/2012 0000   LDLCALC 119 08/07/2012 0000   HEMOGLOBIN A1C No results found for: HGBA1C, MPG TSH No results for input(s): TSH in the last 8760 hours.  PRN Meds:. Medications Discontinued During This Encounter  Medication Reason  . Fluticasone-Salmeterol (ADVAIR DISKUS) 250-50 MCG/DOSE AEPB Discontinued by provider  . levalbuterol (XOPENEX) 0.63 MG/3ML nebulizer solution Discontinued by provider   Current Meds  Medication Sig  . albuterol (PROVENTIL HFA;VENTOLIN HFA) 108 (90 Base) MCG/ACT inhaler as needed.  . Cholecalciferol (VITAMIN D3 PO) Take 5,000 Int'l Units by mouth daily.   Marland Kitchen diltiazem (CARTIA XT) 180 MG 24 hr capsule Take 180 mg by mouth daily.  Marland Kitchen esomeprazole (NEXIUM) 20 MG capsule daily.  . Fluticasone-Salmeterol (WIXELA INHUB) 250-50 MCG/DOSE AEPB Inhale 1 puff into the lungs 2 (two) times daily.  . predniSONE (DELTASONE) 10 MG tablet Take 10 mg by mouth  every other day. qod  . Tiotropium Bromide Monohydrate (SPIRIVA RESPIMAT) 1.25 MCG/ACT AERS Inhale 2 puffs into the lungs daily.  Marland Kitchen triamterene-hydrochlorothiazide (DYAZIDE) 37.5-25 MG capsule Take 1 capsule by mouth daily.    Cardiac Studies:   Echocardiogram 02/06/2020:  Left ventricle cavity is normal in size and wall thickness. Normal global wall motion. Normal LV systolic function with EF 59%. Indeterminate  diastolic filling pattern.  Mild to moderate mitral regurgitation.  Mild to moderate tricuspid regurgitation. Estimated pulmonary artery  systolic pressure 37 mmHg.  30 day event monitor 03/19-04/17/2020: Normal sinus rhythm. 3 patient triggered events without reported symptoms occurred correlating with sinus rhythm. No A fib or SVT was noted.   EKG:  EKG 02/06/2020: Normal sinus rhythm with rate of 70 bpm, normal axis.  Poor R wave progression, cannot exclude anteroseptal infarct old.  No evidence of ischemia.  No significant change from 09/05/2018.   Assessment:   Essential hypertension - Plan: EKG 12-Lead  Dyspnea on exertion  Nonrheumatic tricuspid valve regurgitation - Plan: PCV ECHOCARDIOGRAM COMPLETE  Myasthenia gravis, AChR antibody positive (HCC)  Moderate mitral regurgitation - Plan: PCV ECHOCARDIOGRAM COMPLETE   Recommendations:    ZOANNE Mcclain  is a 79 y.o.  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 Docia Furl at [facility].    She is presently doing well and essentially remains asymptomatic, no change in physical exam, I reviewed the results of the echocardiogram.  She does have moderate MR and TR and also mild pulmonary hypertension, I would like to repeat her echocardiogram again in a year and see her back at that time.  If her echocardiogram remains stable, we could consider changing her  visits to as needed visits.  Her blood pressure was slightly elevated today, patient's blood pressure has been well controlled at other physician office visits and she will follow-up with Dr. Willey Blade soon.  I did not make any changes to her medications.  I will see her back in a year.  I have advised her to increase her physical activity.  Since last office visit dyspnea is remained stable, no further palpitations or chest pain.   Yates Decamp, MD, [facility] 02/16/2020, 12:06 PM Office: [phone]

## 2020-03-10 ENCOUNTER — Ambulatory Visit: Payer: Medicare PPO

## 2020-03-11 ENCOUNTER — Ambulatory Visit
Admission: RE | Admit: 2020-03-11 | Discharge: 2020-03-11 | Disposition: A | Discharge status: Home / Self Care | Payer: Medicare PPO | Source: Ambulatory Visit | Attending: Internal Medicine | Admitting: Internal Medicine

## 2020-03-11 ENCOUNTER — Other Ambulatory Visit: Payer: Self-pay

## 2020-03-11 DIAGNOSIS — Z1231 Encounter for screening mammogram for malignant neoplasm of breast: Secondary | ICD-10-CM

## 2020-07-16 ENCOUNTER — Other Ambulatory Visit: Payer: Self-pay | Admitting: Critical Care Medicine

## 2020-08-05 ENCOUNTER — Other Ambulatory Visit: Payer: Self-pay | Admitting: Internal Medicine

## 2020-08-05 ENCOUNTER — Other Ambulatory Visit: Payer: Self-pay

## 2020-08-05 ENCOUNTER — Ambulatory Visit
Admission: RE | Admit: 2020-08-05 | Discharge: 2020-08-05 | Disposition: A | Discharge status: Home / Self Care | Payer: Medicare PPO | Source: Ambulatory Visit | Attending: Internal Medicine | Admitting: Internal Medicine

## 2020-08-05 DIAGNOSIS — R52 Pain, unspecified: Secondary | ICD-10-CM

## 2020-10-14 ENCOUNTER — Other Ambulatory Visit: Payer: Self-pay | Admitting: Critical Care Medicine

## 2020-12-31 ENCOUNTER — Other Ambulatory Visit: Payer: Self-pay

## 2020-12-31 ENCOUNTER — Ambulatory Visit: Payer: Medicare PPO

## 2020-12-31 DIAGNOSIS — I340 Nonrheumatic mitral (valve) insufficiency: Secondary | ICD-10-CM

## 2020-12-31 DIAGNOSIS — I361 Nonrheumatic tricuspid (valve) insufficiency: Secondary | ICD-10-CM

## 2021-01-05 NOTE — Progress Notes (Signed)
Called patient, Emily Mcclain, Emily Mcclain

## 2021-01-05 NOTE — Progress Notes (Signed)
Called and spoke with patient regarding Her echocardiogram results.

## 2021-01-24 ENCOUNTER — Other Ambulatory Visit: Payer: Medicare PPO

## 2021-01-31 ENCOUNTER — Other Ambulatory Visit: Payer: Self-pay

## 2021-01-31 ENCOUNTER — Ambulatory Visit: Payer: Medicare PPO | Admitting: Cardiology

## 2021-01-31 ENCOUNTER — Encounter: Payer: Self-pay | Admitting: Cardiology

## 2021-01-31 VITALS — BP 119/73 | HR 79 | Temp 98.2°F | Resp 16 | Ht 60.0 in | Wt 136.0 lb

## 2021-01-31 DIAGNOSIS — R0609 Other forms of dyspnea: Secondary | ICD-10-CM

## 2021-01-31 DIAGNOSIS — I10 Essential (primary) hypertension: Secondary | ICD-10-CM

## 2021-01-31 DIAGNOSIS — R0600 Dyspnea, unspecified: Secondary | ICD-10-CM

## 2021-01-31 DIAGNOSIS — I340 Nonrheumatic mitral (valve) insufficiency: Secondary | ICD-10-CM

## 2021-01-31 NOTE — Progress Notes (Signed)
Primary Physician:  Gwenyth Bender, MD   Patient ID: Emily Mcclain, female    DOB: 1940/07/01, 80 y.o.   MRN: [id number]  Subjective:    No chief complaint on file.  HPI: Emily Mcclain  is a 80 y.o. 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 Docia Furl at [facility].    She is presently doing well and essentially remains asymptomatic except for mild chronic dyspnea without any other symptoms of PND or orthopnea.   Past Medical History:  Diagnosis Date   Allergic rhinitis    Anemia    Asthma    Generalized headaches    H/O myasthenia gravis    not confirmed per daughter   Hypertension    Obesity (BMI 30.0-34.9)     Past Surgical History:  Procedure Laterality Date   ABDOMINAL HYSTERECTOMY     BUNIONECTOMY Left    Social History   Tobacco Use   Smoking status: Never   Smokeless tobacco: Never  Substance Use Topics   Alcohol use: No   Marital Status: Widowed   Review of Systems  Cardiovascular:  Positive for dyspnea on exertion. Negative for chest pain and leg swelling.  Gastrointestinal:  Negative for melena.  Objective:  Blood pressure 119/73, pulse 79, temperature 98.2 F (36.8 C), temperature source Temporal, resp. rate 16, height 5' (1.524 m), weight 136 lb (61.7 kg), SpO2 98 %. Body mass index is 26.56 kg/m.  Vitals with BMI 01/31/2021 02/16/2020 10/01/2019  Height 5\' 0"  5\' 0"  5\' 0"   Weight 136 lbs 143 lbs 149 lbs 3 oz  BMI 26.56 27.93 29.14  Systolic 119 138  Diastolic 73 84 78  Pulse 79 68 83       Physical Exam Cardiovascular:     Rate and Rhythm: Normal rate and regular rhythm.     Pulses: Normal pulses and intact distal pulses.     Heart sounds: Murmur heard.  Blowing midsystolic murmur is present with a grade of 2/6 radiating to the apex.    No gallop.     Comments: No leg edema, no JVD. Pulmonary:     Effort: Pulmonary effort is  normal.     Breath sounds: Normal breath sounds.  Abdominal:     General: Bowel sounds are normal.     Palpations: Abdomen is soft.   Radiology: No results found.  Laboratory examination:    CMP Latest Ref Rng & Units 06/27/2018 08/07/2012  Glucose 65 - 99 mg/dL 84 -  BUN 8 - 27 mg/dL 15 -  Creatinine 852 - 1.00 mg/dL 08/26/2018) -  Sodium 08/09/2012 - 144 mmol/L 140 -  Potassium 3.5 - 5.2 mmol/L 4.1 3.8  Chloride 96 - 106 mmol/L 99 -  CO2 20 - 29 mmol/L 28 -  Calcium 8.7 - 10.3 mg/dL 7.78 -  Total Protein 6.0 - 8.5 g/dL 7.3 -  Total Bilirubin 0.0 - 1.2 mg/dL 0.2 -  Alkaline Phos 39 - 117 IU/L 82 -  AST 0 - 40 IU/L 20 -  ALT 0 - 32 IU/L 12 -   CBC Latest Ref Rng & Units 08/29/2019 06/27/2018 08/07/2012  WBC 4.0 - 10.5 K/uL 6.4 5.3 5.4  Hemoglobin 12.0 - 15.0 g/dL 10/29/2019 08/26/2018 11.4(A)  Hematocrit 36.0 - 46.0 % 37.0 36.3 34(A)  Platelets 150.0 - 400.0 K/uL 251.0 284 -   External labs:    Current Meds  Medication Sig   albuterol (PROVENTIL HFA;VENTOLIN HFA) 108 (90 Base) MCG/ACT inhaler as needed.   Cholecalciferol (VITAMIN D3 PO) Take 5,000 Int'l Units by mouth daily.    diltiazem (CARDIZEM CD) 180 MG 24 hr capsule Take 180 mg by mouth daily.   esomeprazole (NEXIUM) 20 MG capsule daily.   predniSONE (DELTASONE) 10 MG tablet Take 10 mg by mouth every other day. qod   Tiotropium Bromide Monohydrate (SPIRIVA RESPIMAT) 1.25 MCG/ACT AERS Inhale 2 puffs into the lungs daily.   triamterene-hydrochlorothiazide (DYAZIDE) 37.5-25 MG capsule Take 1 capsule by mouth daily.   WIXELA INHUB 250-50 MCG/DOSE AEPB TAKE 1 PUFF BY MOUTH TWICE A DAY    Cardiac Studies:   30 day event monitor 03/19-04/17/2020: Normal sinus rhythm. 3 patient triggered events without reported symptoms occurred correlating with sinus rhythm. No A fib or SVT was noted.   PCV ECHOCARDIOGRAM COMPLETE 12/31/2020 Left ventricle cavity is normal in size and wall thickness. Normal global wall motion. Normal LV systolic function with  EF 60%. Normal diastolic filling pattern. Calculated EF 60%. Trileaflet aortic valve.  Trace aortic regurgitation. Mild to moderate mitral regurgitation. Mild to moderate tricuspid regurgitation. Estimated pulmonary artery systolic pressure 28 mmHg. No significant change compared to previous study on 02/06/2020.   EKG:  EKG 01/31/2021: Normal sinus rhythm with rate of 73 bpm, left atrial enlargement, normal axis.  No evidence of ischemia otherwise normal EKG.  No significant change from 02/06/2020.   Assessment:   Mild mitral regurgitation - Plan: EKG 12-Lead  Dyspnea on exertion  Primary hypertension   Recommendations:    Emily Mcclain  is a 80 y.o.  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 Docia Furl at [facility].    She is presently doing well and essentially remains asymptomatic except for mild chronic dyspnea without any other symptoms of PND or orthopnea.  Blood pressure is well controlled, physical examination except for a very soft midsystolic murmur in the apex is completely normal.  This is not changed over the past 3 years.  As her mitral regurgitation is remained stable, no evidence of pulmonary hypertension by recent echocardiogram, no further evaluation is indicated unless the murmur sounds much more prominent or she develops worsening symptoms of dyspnea on exertion or if there is any clinical evidence of heart failure.  Symptoms of valvular heart disease was extensively discussed with the patient.  No changes in the medications were done by me.  I will see her back on a as needed basis.      Yates Decamp, MD, [facility] 01/31/2021, 12:45 PM Office: [phone]

## 2021-02-08 ENCOUNTER — Other Ambulatory Visit: Payer: Self-pay | Admitting: Critical Care Medicine

## 2021-02-17 ENCOUNTER — Ambulatory Visit: Payer: Medicare PPO | Admitting: Cardiology

## 2021-02-28 ENCOUNTER — Ambulatory Visit: Payer: Medicare PPO | Admitting: Cardiology

## 2021-03-02 ENCOUNTER — Other Ambulatory Visit: Payer: Self-pay | Admitting: Critical Care Medicine

## 2021-03-21 ENCOUNTER — Other Ambulatory Visit: Payer: Self-pay | Admitting: Internal Medicine

## 2021-03-21 DIAGNOSIS — Z1231 Encounter for screening mammogram for malignant neoplasm of breast: Secondary | ICD-10-CM

## 2021-04-25 ENCOUNTER — Other Ambulatory Visit: Payer: Self-pay

## 2021-04-25 ENCOUNTER — Ambulatory Visit
Admission: RE | Admit: 2021-04-25 | Discharge: 2021-04-25 | Disposition: A | Discharge status: Home / Self Care | Payer: Medicare PPO | Source: Ambulatory Visit | Attending: Internal Medicine | Admitting: Internal Medicine

## 2021-04-25 DIAGNOSIS — Z1231 Encounter for screening mammogram for malignant neoplasm of breast: Secondary | ICD-10-CM

## 2021-09-06 ENCOUNTER — Other Ambulatory Visit: Payer: Self-pay | Admitting: Critical Care Medicine

## 2021-10-03 ENCOUNTER — Other Ambulatory Visit: Payer: Self-pay | Admitting: Critical Care Medicine

## 2021-10-10 ENCOUNTER — Other Ambulatory Visit: Payer: Self-pay | Admitting: Critical Care Medicine

## 2022-10-30 ENCOUNTER — Other Ambulatory Visit: Payer: Self-pay

## 2022-10-30 ENCOUNTER — Ambulatory Visit
Admission: RE | Admit: 2022-10-30 | Discharge: 2022-10-30 | Disposition: A | Discharge status: Home / Self Care | Payer: Medicare PPO | Source: Ambulatory Visit | Attending: Internal Medicine | Admitting: Internal Medicine

## 2022-10-30 DIAGNOSIS — R059 Cough, unspecified: Secondary | ICD-10-CM

## 2022-10-30 DIAGNOSIS — J45909 Unspecified asthma, uncomplicated: Secondary | ICD-10-CM

## 2023-07-07 DIAGNOSIS — J45998 Other asthma: Secondary | ICD-10-CM | POA: Diagnosis not present

## 2023-07-07 DIAGNOSIS — J441 Chronic obstructive pulmonary disease with (acute) exacerbation: Secondary | ICD-10-CM | POA: Diagnosis not present

## 2023-07-23 DIAGNOSIS — J449 Chronic obstructive pulmonary disease, unspecified: Secondary | ICD-10-CM | POA: Diagnosis not present

## 2023-07-23 DIAGNOSIS — M62831 Muscle spasm of calf: Secondary | ICD-10-CM | POA: Diagnosis not present

## 2023-07-23 DIAGNOSIS — K219 Gastro-esophageal reflux disease without esophagitis: Secondary | ICD-10-CM | POA: Diagnosis not present

## 2023-07-23 DIAGNOSIS — J45991 Cough variant asthma: Secondary | ICD-10-CM | POA: Diagnosis not present

## 2023-07-23 DIAGNOSIS — E782 Mixed hyperlipidemia: Secondary | ICD-10-CM | POA: Diagnosis not present

## 2023-07-23 DIAGNOSIS — R7303 Prediabetes: Secondary | ICD-10-CM | POA: Diagnosis not present

## 2023-07-23 DIAGNOSIS — I119 Hypertensive heart disease without heart failure: Secondary | ICD-10-CM | POA: Diagnosis not present

## 2023-07-23 DIAGNOSIS — M5431 Sciatica, right side: Secondary | ICD-10-CM | POA: Diagnosis not present

## 2023-07-23 DIAGNOSIS — Z6824 Body mass index (BMI) 24.0-24.9, adult: Secondary | ICD-10-CM | POA: Diagnosis not present

## 2023-08-07 DIAGNOSIS — J45998 Other asthma: Secondary | ICD-10-CM | POA: Diagnosis not present

## 2023-08-07 DIAGNOSIS — J441 Chronic obstructive pulmonary disease with (acute) exacerbation: Secondary | ICD-10-CM | POA: Diagnosis not present

## 2023-08-24 DIAGNOSIS — R7303 Prediabetes: Secondary | ICD-10-CM | POA: Diagnosis not present

## 2023-08-24 DIAGNOSIS — I119 Hypertensive heart disease without heart failure: Secondary | ICD-10-CM | POA: Diagnosis not present

## 2023-08-24 DIAGNOSIS — E782 Mixed hyperlipidemia: Secondary | ICD-10-CM | POA: Diagnosis not present

## 2023-09-04 DIAGNOSIS — J45998 Other asthma: Secondary | ICD-10-CM | POA: Diagnosis not present

## 2023-09-04 DIAGNOSIS — J441 Chronic obstructive pulmonary disease with (acute) exacerbation: Secondary | ICD-10-CM | POA: Diagnosis not present

## 2023-10-25 DIAGNOSIS — K219 Gastro-esophageal reflux disease without esophagitis: Secondary | ICD-10-CM | POA: Diagnosis not present

## 2023-10-25 DIAGNOSIS — Z6824 Body mass index (BMI) 24.0-24.9, adult: Secondary | ICD-10-CM | POA: Diagnosis not present

## 2023-10-25 DIAGNOSIS — M62830 Muscle spasm of back: Secondary | ICD-10-CM | POA: Diagnosis not present

## 2023-10-25 DIAGNOSIS — I119 Hypertensive heart disease without heart failure: Secondary | ICD-10-CM | POA: Diagnosis not present

## 2023-10-25 DIAGNOSIS — J449 Chronic obstructive pulmonary disease, unspecified: Secondary | ICD-10-CM | POA: Diagnosis not present

## 2023-10-25 DIAGNOSIS — M5431 Sciatica, right side: Secondary | ICD-10-CM | POA: Diagnosis not present

## 2023-10-25 DIAGNOSIS — E782 Mixed hyperlipidemia: Secondary | ICD-10-CM | POA: Diagnosis not present

## 2023-10-25 DIAGNOSIS — R7303 Prediabetes: Secondary | ICD-10-CM | POA: Diagnosis not present

## 2023-10-25 DIAGNOSIS — J45991 Cough variant asthma: Secondary | ICD-10-CM | POA: Diagnosis not present

## 2023-11-19 DIAGNOSIS — H903 Sensorineural hearing loss, bilateral: Secondary | ICD-10-CM | POA: Diagnosis not present

## 2023-11-21 DIAGNOSIS — Z809 Family history of malignant neoplasm, unspecified: Secondary | ICD-10-CM | POA: Diagnosis not present

## 2023-11-21 DIAGNOSIS — K219 Gastro-esophageal reflux disease without esophagitis: Secondary | ICD-10-CM | POA: Diagnosis not present

## 2023-11-21 DIAGNOSIS — N182 Chronic kidney disease, stage 2 (mild): Secondary | ICD-10-CM | POA: Diagnosis not present

## 2023-11-21 DIAGNOSIS — I129 Hypertensive chronic kidney disease with stage 1 through stage 4 chronic kidney disease, or unspecified chronic kidney disease: Secondary | ICD-10-CM | POA: Diagnosis not present

## 2023-11-21 DIAGNOSIS — J45909 Unspecified asthma, uncomplicated: Secondary | ICD-10-CM | POA: Diagnosis not present

## 2023-11-29 DIAGNOSIS — H35363 Drusen (degenerative) of macula, bilateral: Secondary | ICD-10-CM | POA: Diagnosis not present

## 2023-11-29 DIAGNOSIS — H40013 Open angle with borderline findings, low risk, bilateral: Secondary | ICD-10-CM | POA: Diagnosis not present

## 2023-11-29 DIAGNOSIS — G7000 Myasthenia gravis without (acute) exacerbation: Secondary | ICD-10-CM | POA: Diagnosis not present

## 2023-11-29 DIAGNOSIS — H25813 Combined forms of age-related cataract, bilateral: Secondary | ICD-10-CM | POA: Diagnosis not present

## 2023-11-29 DIAGNOSIS — H524 Presbyopia: Secondary | ICD-10-CM | POA: Diagnosis not present

## 2024-01-14 DIAGNOSIS — R7303 Prediabetes: Secondary | ICD-10-CM | POA: Diagnosis not present

## 2024-01-14 DIAGNOSIS — E782 Mixed hyperlipidemia: Secondary | ICD-10-CM | POA: Diagnosis not present

## 2024-01-14 DIAGNOSIS — I119 Hypertensive heart disease without heart failure: Secondary | ICD-10-CM | POA: Diagnosis not present

## 2024-01-14 DIAGNOSIS — M62831 Muscle spasm of calf: Secondary | ICD-10-CM | POA: Diagnosis not present

## 2024-01-24 DIAGNOSIS — I119 Hypertensive heart disease without heart failure: Secondary | ICD-10-CM | POA: Diagnosis not present

## 2024-01-24 DIAGNOSIS — E782 Mixed hyperlipidemia: Secondary | ICD-10-CM | POA: Diagnosis not present

## 2024-01-24 DIAGNOSIS — J449 Chronic obstructive pulmonary disease, unspecified: Secondary | ICD-10-CM | POA: Diagnosis not present

## 2024-01-24 DIAGNOSIS — R7303 Prediabetes: Secondary | ICD-10-CM | POA: Diagnosis not present

## 2024-01-24 DIAGNOSIS — K219 Gastro-esophageal reflux disease without esophagitis: Secondary | ICD-10-CM | POA: Diagnosis not present

## 2024-01-24 DIAGNOSIS — Z6824 Body mass index (BMI) 24.0-24.9, adult: Secondary | ICD-10-CM | POA: Diagnosis not present

## 2024-01-24 DIAGNOSIS — J4531 Mild persistent asthma with (acute) exacerbation: Secondary | ICD-10-CM | POA: Diagnosis not present

## 2024-01-24 DIAGNOSIS — M5431 Sciatica, right side: Secondary | ICD-10-CM | POA: Diagnosis not present

## 2024-01-24 DIAGNOSIS — G7000 Myasthenia gravis without (acute) exacerbation: Secondary | ICD-10-CM | POA: Diagnosis not present

## 2024-02-05 DIAGNOSIS — R0602 Shortness of breath: Secondary | ICD-10-CM | POA: Diagnosis not present

## 2024-02-05 DIAGNOSIS — Z1152 Encounter for screening for COVID-19: Secondary | ICD-10-CM | POA: Diagnosis not present

## 2024-02-05 DIAGNOSIS — I10 Essential (primary) hypertension: Secondary | ICD-10-CM | POA: Diagnosis not present

## 2024-02-05 DIAGNOSIS — J45901 Unspecified asthma with (acute) exacerbation: Secondary | ICD-10-CM | POA: Diagnosis not present

## 2024-02-05 DIAGNOSIS — Z886 Allergy status to analgesic agent status: Secondary | ICD-10-CM | POA: Diagnosis not present

## 2024-02-25 DIAGNOSIS — I119 Hypertensive heart disease without heart failure: Secondary | ICD-10-CM | POA: Diagnosis not present

## 2024-02-25 DIAGNOSIS — K219 Gastro-esophageal reflux disease without esophagitis: Secondary | ICD-10-CM | POA: Diagnosis not present

## 2024-02-25 DIAGNOSIS — J4531 Mild persistent asthma with (acute) exacerbation: Secondary | ICD-10-CM | POA: Diagnosis not present

## 2024-02-25 DIAGNOSIS — Z6823 Body mass index (BMI) 23.0-23.9, adult: Secondary | ICD-10-CM | POA: Diagnosis not present

## 2024-02-25 DIAGNOSIS — M5431 Sciatica, right side: Secondary | ICD-10-CM | POA: Diagnosis not present

## 2024-02-25 DIAGNOSIS — G7000 Myasthenia gravis without (acute) exacerbation: Secondary | ICD-10-CM | POA: Diagnosis not present

## 2024-02-25 DIAGNOSIS — E782 Mixed hyperlipidemia: Secondary | ICD-10-CM | POA: Diagnosis not present

## 2024-02-25 DIAGNOSIS — J449 Chronic obstructive pulmonary disease, unspecified: Secondary | ICD-10-CM | POA: Diagnosis not present

## 2024-02-25 DIAGNOSIS — R7303 Prediabetes: Secondary | ICD-10-CM | POA: Diagnosis not present

## 2024-02-25 DIAGNOSIS — Z23 Encounter for immunization: Secondary | ICD-10-CM | POA: Diagnosis not present

## 2024-04-24 DIAGNOSIS — K219 Gastro-esophageal reflux disease without esophagitis: Secondary | ICD-10-CM | POA: Diagnosis not present

## 2024-04-24 DIAGNOSIS — E782 Mixed hyperlipidemia: Secondary | ICD-10-CM | POA: Diagnosis not present

## 2024-04-24 DIAGNOSIS — Z6823 Body mass index (BMI) 23.0-23.9, adult: Secondary | ICD-10-CM | POA: Diagnosis not present

## 2024-04-24 DIAGNOSIS — G7000 Myasthenia gravis without (acute) exacerbation: Secondary | ICD-10-CM | POA: Diagnosis not present

## 2024-04-24 DIAGNOSIS — J449 Chronic obstructive pulmonary disease, unspecified: Secondary | ICD-10-CM | POA: Diagnosis not present

## 2024-04-24 DIAGNOSIS — J4531 Mild persistent asthma with (acute) exacerbation: Secondary | ICD-10-CM | POA: Diagnosis not present

## 2024-04-24 DIAGNOSIS — M5431 Sciatica, right side: Secondary | ICD-10-CM | POA: Diagnosis not present

## 2024-04-24 DIAGNOSIS — R7303 Prediabetes: Secondary | ICD-10-CM | POA: Diagnosis not present

## 2024-04-24 DIAGNOSIS — I119 Hypertensive heart disease without heart failure: Secondary | ICD-10-CM | POA: Diagnosis not present
